# Patient Record
Sex: Male | Born: 1955 | Race: White | Hispanic: No | Marital: Married | Smoking: Former smoker
Health system: Southern US, Community
[De-identification: ages and names within clinical notes are randomized; demographics above are authoritative.]

## PROBLEM LIST (undated history)

## (undated) DIAGNOSIS — E785 Hyperlipidemia, unspecified: Secondary | ICD-10-CM

## (undated) DIAGNOSIS — G4730 Sleep apnea, unspecified: Secondary | ICD-10-CM

## (undated) DIAGNOSIS — E669 Obesity, unspecified: Secondary | ICD-10-CM

## (undated) DIAGNOSIS — I10 Essential (primary) hypertension: Secondary | ICD-10-CM

## (undated) DIAGNOSIS — E119 Type 2 diabetes mellitus without complications: Secondary | ICD-10-CM

## (undated) DIAGNOSIS — K2270 Barrett's esophagus without dysplasia: Secondary | ICD-10-CM

## (undated) HISTORY — PX: OTHER SURGICAL HISTORY: SHX169

## (undated) HISTORY — DX: Hyperlipidemia, unspecified: E78.5

---

## 2005-03-12 ENCOUNTER — Emergency Department (HOSPITAL_COMMUNITY): Admission: EM | Admit: 2005-03-12 | Discharge: 2005-03-13 | Payer: Self-pay | Admitting: Emergency Medicine

## 2007-05-28 ENCOUNTER — Encounter: Admission: RE | Admit: 2007-05-28 | Discharge: 2007-05-28 | Payer: Self-pay | Admitting: Specialist

## 2009-01-05 ENCOUNTER — Emergency Department (HOSPITAL_COMMUNITY): Admission: EM | Admit: 2009-01-05 | Discharge: 2009-01-05 | Payer: Self-pay | Admitting: Emergency Medicine

## 2011-07-17 ENCOUNTER — Encounter (HOSPITAL_COMMUNITY): Payer: Self-pay | Admitting: General Surgery

## 2011-07-17 ENCOUNTER — Telehealth (INDEPENDENT_AMBULATORY_CARE_PROVIDER_SITE_OTHER): Payer: Self-pay | Admitting: General Surgery

## 2011-07-17 ENCOUNTER — Other Ambulatory Visit: Payer: Self-pay

## 2011-07-17 ENCOUNTER — Ambulatory Visit (HOSPITAL_COMMUNITY)
Admission: EM | Admit: 2011-07-17 | Discharge: 2011-07-18 | Disposition: A | Discharge status: Home / Self Care | Payer: No Typology Code available for payment source | Attending: Surgery | Admitting: Surgery

## 2011-07-17 ENCOUNTER — Encounter (HOSPITAL_COMMUNITY): Payer: Self-pay | Admitting: Anesthesiology

## 2011-07-17 ENCOUNTER — Encounter (HOSPITAL_COMMUNITY): Admission: EM | Disposition: A | Discharge status: Home / Self Care | Payer: Self-pay | Source: Home / Self Care

## 2011-07-17 ENCOUNTER — Emergency Department (HOSPITAL_COMMUNITY): Payer: No Typology Code available for payment source | Admitting: Anesthesiology

## 2011-07-17 DIAGNOSIS — Z6830 Body mass index (BMI) 30.0-30.9, adult: Secondary | ICD-10-CM | POA: Insufficient documentation

## 2011-07-17 DIAGNOSIS — E669 Obesity, unspecified: Secondary | ICD-10-CM

## 2011-07-17 DIAGNOSIS — K420 Umbilical hernia with obstruction, without gangrene: Secondary | ICD-10-CM | POA: Diagnosis present

## 2011-07-17 DIAGNOSIS — K55059 Acute (reversible) ischemia of intestine, part and extent unspecified: Secondary | ICD-10-CM | POA: Insufficient documentation

## 2011-07-17 DIAGNOSIS — E785 Hyperlipidemia, unspecified: Secondary | ICD-10-CM

## 2011-07-17 DIAGNOSIS — Z79899 Other long term (current) drug therapy: Secondary | ICD-10-CM | POA: Insufficient documentation

## 2011-07-17 DIAGNOSIS — I10 Essential (primary) hypertension: Secondary | ICD-10-CM | POA: Diagnosis present

## 2011-07-17 DIAGNOSIS — R630 Anorexia: Secondary | ICD-10-CM | POA: Insufficient documentation

## 2011-07-17 HISTORY — DX: Essential (primary) hypertension: I10

## 2011-07-17 HISTORY — PX: UMBILICAL HERNIA REPAIR: SHX196

## 2011-07-17 HISTORY — PX: HERNIA REPAIR: SHX51

## 2011-07-17 HISTORY — DX: Obesity, unspecified: E66.9

## 2011-07-17 HISTORY — DX: Hyperlipidemia, unspecified: E78.5

## 2011-07-17 LAB — URINALYSIS, ROUTINE W REFLEX MICROSCOPIC
Bilirubin Urine: NEGATIVE
Glucose, UA: NEGATIVE mg/dL
Ketones, ur: NEGATIVE mg/dL
Leukocytes, UA: NEGATIVE
Nitrite: NEGATIVE
Protein, ur: NEGATIVE mg/dL
pH: 5 (ref 5.0–8.0)

## 2011-07-17 LAB — COMPREHENSIVE METABOLIC PANEL
AST: 30 U/L (ref 0–37)
Albumin: 4 g/dL (ref 3.5–5.2)
Alkaline Phosphatase: 60 U/L (ref 39–117)
BUN: 16 mg/dL (ref 6–23)
CO2: 29 mEq/L (ref 19–32)
Chloride: 102 mEq/L (ref 96–112)
Creatinine, Ser: 0.87 mg/dL (ref 0.50–1.35)
GFR calc Af Amer: >90 mL/min (ref >90)
GFR calc non Af Amer: >90 mL/min (ref >90)
Glucose, Bld: 88 mg/dL (ref 70–99)
Potassium: 3.1 mEq/L — ABNORMAL LOW (ref 3.5–5.1)
Total Bilirubin: 0.9 mg/dL (ref 0.3–1.2)
Total Protein: 7 g/dL (ref 6.0–8.3)

## 2011-07-17 LAB — DIFFERENTIAL
Eosinophils Relative: 2 % (ref 0–5)
Lymphocytes Relative: 23 % (ref 12–46)
Lymphs Abs: 1.6 10*3/uL (ref 0.7–4.0)
Monocytes Absolute: 0.5 10*3/uL (ref 0.1–1.0)
Monocytes Relative: 8 % (ref 3–12)
Neutro Abs: 4.4 10*3/uL (ref 1.7–7.7)
Neutrophils Relative %: 66 % (ref 43–77)

## 2011-07-17 LAB — CBC
Hemoglobin: 15.6 g/dL (ref 13.0–17.0)
MCHC: 35.1 g/dL (ref 30.0–36.0)
MCV: 88.1 fL (ref 78.0–100.0)
RBC: 5.04 MIL/uL (ref 4.22–5.81)
RDW: 13.1 % (ref 11.5–15.5)

## 2011-07-17 SURGERY — OMENTECTOMY
Anesthesia: General | Site: Abdomen | Wound class: Clean Contaminated

## 2011-07-17 MED ORDER — ONDANSETRON HCL 4 MG/2ML IJ SOLN: 4.0000 mg | Freq: Four times a day (QID) | INTRAMUSCULAR | Status: DC | PRN

## 2011-07-17 MED ORDER — FENTANYL CITRATE 0.05 MG/ML IJ SOLN
25.0000 ug | INTRAMUSCULAR | Status: DC | PRN
  Administered 2011-07-17: 50 ug via INTRAVENOUS

## 2011-07-17 MED ORDER — MORPHINE SULFATE 4 MG/ML IJ SOLN: 4.0000 mg | INTRAMUSCULAR | Status: DC | PRN

## 2011-07-17 MED ORDER — CEFAZOLIN SODIUM-DEXTROSE 2-3 GM-% IV SOLR
INTRAVENOUS | Status: AC
  Filled 2011-07-17: qty 50

## 2011-07-17 MED ORDER — EPHEDRINE SULFATE 50 MG/ML IJ SOLN
INTRAMUSCULAR | Status: DC | PRN
  Administered 2011-07-17: 10 mg via INTRAVENOUS

## 2011-07-17 MED ORDER — SUCCINYLCHOLINE CHLORIDE 20 MG/ML IJ SOLN
INTRAMUSCULAR | Status: DC | PRN
  Administered 2011-07-17: 140 mg via INTRAVENOUS

## 2011-07-17 MED ORDER — PROPOFOL 10 MG/ML IV BOLUS
INTRAVENOUS | Status: DC | PRN
  Administered 2011-07-17: 200 mg via INTRAVENOUS

## 2011-07-17 MED ORDER — FENTANYL CITRATE 0.05 MG/ML IJ SOLN
INTRAMUSCULAR | Status: AC
  Filled 2011-07-17: qty 2

## 2011-07-17 MED ORDER — MORPHINE SULFATE 4 MG/ML IJ SOLN
4.0000 mg | INTRAMUSCULAR | Status: DC | PRN
  Administered 2011-07-17: 4 mg via INTRAVENOUS
  Filled 2011-07-17: qty 1

## 2011-07-17 MED ORDER — SIMVASTATIN 10 MG PO TABS
20.0000 mg | ORAL_TABLET | Freq: Every day | ORAL | Status: DC
  Filled 2011-07-17: qty 2

## 2011-07-17 MED ORDER — ONDANSETRON HCL 4 MG PO TABS: 4.0000 mg | ORAL_TABLET | Freq: Four times a day (QID) | ORAL | Status: DC | PRN

## 2011-07-17 MED ORDER — DEXAMETHASONE SODIUM PHOSPHATE 10 MG/ML IJ SOLN
INTRAMUSCULAR | Status: DC | PRN
  Administered 2011-07-17: 10 mg via INTRAVENOUS

## 2011-07-17 MED ORDER — LACTATED RINGERS IV SOLN: INTRAVENOUS | Status: DC

## 2011-07-17 MED ORDER — HEPARIN SODIUM (PORCINE) 5000 UNIT/ML IJ SOLN
5000.0000 [IU] | Freq: Three times a day (TID) | INTRAMUSCULAR | Status: DC
  Administered 2011-07-18 (×2): 5000 [IU] via SUBCUTANEOUS
  Filled 2011-07-17 (×4): qty 1

## 2011-07-17 MED ORDER — ACETAMINOPHEN 10 MG/ML IV SOLN
INTRAVENOUS | Status: DC | PRN
  Administered 2011-07-17: 1000 mg via INTRAVENOUS

## 2011-07-17 MED ORDER — ASPIRIN EC 81 MG PO TBEC
81.0000 mg | DELAYED_RELEASE_TABLET | Freq: Every day | ORAL | Status: DC
  Administered 2011-07-17 – 2011-07-18 (×2): 81 mg via ORAL
  Filled 2011-07-17 (×2): qty 1

## 2011-07-17 MED ORDER — POTASSIUM CHLORIDE IN NACL 20-0.9 MEQ/L-% IV SOLN
INTRAVENOUS | Status: DC
  Administered 2011-07-17 – 2011-07-18 (×2): via INTRAVENOUS
  Filled 2011-07-17 (×5): qty 1000

## 2011-07-17 MED ORDER — BUPIVACAINE-EPINEPHRINE (PF) 0.5% -1:200000 IJ SOLN
INTRAMUSCULAR | Status: AC
  Filled 2011-07-17: qty 10

## 2011-07-17 MED ORDER — GLYCOPYRROLATE 0.2 MG/ML IJ SOLN
INTRAMUSCULAR | Status: DC | PRN
  Administered 2011-07-17: .5 mg via INTRAVENOUS

## 2011-07-17 MED ORDER — HYDROCHLOROTHIAZIDE 25 MG PO TABS
25.0000 mg | ORAL_TABLET | Freq: Every day | ORAL | Status: DC
  Administered 2011-07-17: 25 mg via ORAL
  Filled 2011-07-17 (×2): qty 1

## 2011-07-17 MED ORDER — SODIUM CHLORIDE 0.9 % IV SOLN
INTRAVENOUS | Status: DC
  Administered 2011-07-17: 16:00:00 via INTRAVENOUS

## 2011-07-17 MED ORDER — CEFAZOLIN SODIUM-DEXTROSE 2-3 GM-% IV SOLR
2.0000 g | Freq: Three times a day (TID) | INTRAVENOUS | Status: DC
  Administered 2011-07-17 (×2): 2 g via INTRAVENOUS
  Filled 2011-07-17 (×3): qty 50

## 2011-07-17 MED ORDER — LISINOPRIL-HYDROCHLOROTHIAZIDE 20-25 MG PO TABS
1.0000 | ORAL_TABLET | Freq: Every day | ORAL | Status: DC
Start: 2011-07-17 — End: 2011-07-17

## 2011-07-17 MED ORDER — ASPIRIN 81 MG PO TABS: 81.0000 mg | ORAL_TABLET | Freq: Every day | ORAL | Status: DC

## 2011-07-17 MED ORDER — SODIUM CHLORIDE 0.9 % IR SOLN
Status: DC | PRN
  Administered 2011-07-17: 1000 mL

## 2011-07-17 MED ORDER — HYDROCODONE-ACETAMINOPHEN 5-325 MG PO TABS
1.0000 | ORAL_TABLET | ORAL | Status: DC | PRN
  Administered 2011-07-18: 1 via ORAL
  Filled 2011-07-17: qty 2

## 2011-07-17 MED ORDER — ONDANSETRON HCL 4 MG/2ML IJ SOLN
INTRAMUSCULAR | Status: DC | PRN
  Administered 2011-07-17: 4 mg via INTRAVENOUS

## 2011-07-17 MED ORDER — ACETAMINOPHEN 10 MG/ML IV SOLN
INTRAVENOUS | Status: AC
  Filled 2011-07-17: qty 100

## 2011-07-17 MED ORDER — BUPIVACAINE-EPINEPHRINE 0.5% -1:200000 IJ SOLN
INTRAMUSCULAR | Status: DC | PRN
  Administered 2011-07-17: 20 mL

## 2011-07-17 MED ORDER — LISINOPRIL 20 MG PO TABS
20.0000 mg | ORAL_TABLET | Freq: Every day | ORAL | Status: DC
  Administered 2011-07-17: 20 mg via ORAL
  Filled 2011-07-17 (×2): qty 1

## 2011-07-17 MED ORDER — LACTATED RINGERS IV SOLN
INTRAVENOUS | Status: DC | PRN
  Administered 2011-07-17: 18:00:00 via INTRAVENOUS

## 2011-07-17 MED ORDER — MIDAZOLAM HCL 5 MG/5ML IJ SOLN: INTRAMUSCULAR | Status: DC | PRN

## 2011-07-17 MED ORDER — ROCURONIUM BROMIDE 100 MG/10ML IV SOLN
INTRAVENOUS | Status: DC | PRN
  Administered 2011-07-17: 20 mg via INTRAVENOUS
  Administered 2011-07-17: 5 mg via INTRAVENOUS

## 2011-07-17 MED ORDER — PROMETHAZINE HCL 25 MG/ML IJ SOLN: 6.2500 mg | INTRAMUSCULAR | Status: DC | PRN

## 2011-07-17 MED ORDER — HEPARIN SODIUM (PORCINE) 5000 UNIT/ML IJ SOLN
5000.0000 [IU] | Freq: Three times a day (TID) | INTRAMUSCULAR | Status: DC
  Filled 2011-07-17 (×6): qty 1

## 2011-07-17 MED ORDER — NEOSTIGMINE METHYLSULFATE 1 MG/ML IJ SOLN
INTRAMUSCULAR | Status: DC | PRN
  Administered 2011-07-17: 4 mg via INTRAVENOUS

## 2011-07-17 MED ORDER — FENTANYL CITRATE 0.05 MG/ML IJ SOLN
INTRAMUSCULAR | Status: DC | PRN
  Administered 2011-07-17 (×3): 50 ug via INTRAVENOUS
  Administered 2011-07-17: 100 ug via INTRAVENOUS

## 2011-07-17 MED ORDER — CEFAZOLIN SODIUM-DEXTROSE 2-3 GM-% IV SOLR
2.0000 g | Freq: Three times a day (TID) | INTRAVENOUS | Status: DC
  Administered 2011-07-18 (×2): 2 g via INTRAVENOUS
  Filled 2011-07-17 (×5): qty 50

## 2011-07-17 SURGICAL SUPPLY — 39 items
APPLICATOR COTTON TIP 6IN STRL (MISCELLANEOUS) ×1 IMPLANT
BLADE EXTENDED COATED 6.5IN (ELECTRODE) IMPLANT
BLADE HEX COATED 2.75 (ELECTRODE) ×3 IMPLANT
CANISTER SUCTION 2500CC (MISCELLANEOUS) ×1 IMPLANT
CLOTH BEACON ORANGE TIMEOUT ST (SAFETY) ×3 IMPLANT
COVER MAYO STAND STRL (DRAPES) ×2 IMPLANT
DRAPE LAPAROSCOPIC ABDOMINAL (DRAPES) ×3 IMPLANT
DRAPE WARM FLUID 44X44 (DRAPE) ×2 IMPLANT
ELECT REM PT RETURN 9FT ADLT (ELECTROSURGICAL) ×3
ELECTRODE REM PT RTRN 9FT ADLT (ELECTROSURGICAL) ×2 IMPLANT
GLOVE BIOGEL PI IND STRL 7.0 (GLOVE) ×2 IMPLANT
GLOVE BIOGEL PI INDICATOR 7.0 (GLOVE) ×1
GLOVE EUDERMIC 7 POWDERFREE (GLOVE) ×3 IMPLANT
GOWN STRL NON-REIN LRG LVL3 (GOWN DISPOSABLE) ×3 IMPLANT
GOWN STRL REIN XL XLG (GOWN DISPOSABLE) ×6 IMPLANT
KIT BASIN OR (CUSTOM PROCEDURE TRAY) ×3 IMPLANT
NS IRRIG 1000ML POUR BTL (IV SOLUTION) ×3 IMPLANT
PACK GENERAL/GYN (CUSTOM PROCEDURE TRAY) ×3 IMPLANT
SPONGE GAUZE 4X4 12PLY (GAUZE/BANDAGES/DRESSINGS) ×3 IMPLANT
SPONGE LAP 18X18 X RAY DECT (DISPOSABLE) ×2 IMPLANT
STAPLER VISISTAT 35W (STAPLE) ×3 IMPLANT
SUCTION POOLE TIP (SUCTIONS) ×2 IMPLANT
SUT MNCRL AB 4-0 PS2 18 (SUTURE) ×2 IMPLANT
SUT NOVA NAB DX-16 0-1 5-0 T12 (SUTURE) ×2 IMPLANT
SUT PDS AB 1 CTX 36 (SUTURE) IMPLANT
SUT PDS AB 1 TP1 96 (SUTURE) ×2 IMPLANT
SUT SILK 2 0 (SUTURE) ×3
SUT SILK 2 0 SH CR/8 (SUTURE) ×2 IMPLANT
SUT SILK 2-0 18XBRD TIE 12 (SUTURE) ×1 IMPLANT
SUT SILK 3 0 (SUTURE) ×3
SUT SILK 3 0 SH CR/8 (SUTURE) ×2 IMPLANT
SUT SILK 3-0 18XBRD TIE 12 (SUTURE) ×1 IMPLANT
SUT VIC AB 3-0 SH 18 (SUTURE) ×2 IMPLANT
SUT VICRYL 2 0 18  UND BR (SUTURE) ×1
SUT VICRYL 2 0 18 UND BR (SUTURE) ×1 IMPLANT
TAPE CLOTH SURG 4X10 WHT LF (GAUZE/BANDAGES/DRESSINGS) ×2 IMPLANT
TOWEL OR 17X26 10 PK STRL BLUE (TOWEL DISPOSABLE) ×6 IMPLANT
TRAY FOLEY CATH 14FRSI W/METER (CATHETERS) IMPLANT
YANKAUER SUCT BULB TIP NO VENT (SUCTIONS) ×2 IMPLANT

## 2011-07-17 NOTE — ED Provider Notes (Addendum)
History     CSN: [id number]  Arrival date & time 07/17/11  1404   First MD Initiated Contact with Patient 07/17/11 1414      No chief complaint on file.   (Consider location/radiation/quality/duration/timing/severity/associated sxs/prior treatment) Patient is a 56 y.o. male presenting with abdominal pain. The history is provided by the patient.  Abdominal Pain The primary symptoms of the illness include abdominal pain. The primary symptoms of the illness do not include nausea, vomiting or diarrhea. Primary symptoms comment: hernia which has been tender and swollen since sat The current episode started 2 days ago. The onset of the illness was sudden. The problem has not changed since onset. Associated with: nothing. The patient has not had a change in bowel habit. Additional symptoms associated with the illness include anorexia. Symptoms associated with the illness do not include chills, constipation, urgency, frequency or back pain.    No past medical history on file.  No past surgical history on file.  No family history on file.  History  Substance Use Topics  . Smoking status: Not on file  . Smokeless tobacco: Not on file  . Alcohol Use: Not on file      Review of Systems  Constitutional: Negative for chills.  Gastrointestinal: Positive for abdominal pain and anorexia. Negative for nausea, vomiting, diarrhea and constipation.  Genitourinary: Negative for urgency and frequency.  Musculoskeletal: Negative for back pain.  All other systems reviewed and are negative.    Allergies  Review of patient's allergies indicates not on file.  Home Medications  No current outpatient prescriptions on file.  BP 152/86  Pulse 105  Temp(Src) 98.8 F (37.1 C) (Oral)  Resp 18  SpO2 100%  Physical Exam  Nursing note and vitals reviewed. Constitutional: He is oriented to person, place, and time. He appears well-developed and well-nourished. No distress.  HENT:  Head:  Normocephalic and atraumatic.  Mouth/Throat: Oropharynx is clear and moist.  Eyes: Conjunctivae and EOM are normal. Pupils are equal, round, and reactive to light.  Neck: Normal range of motion. Neck supple.  Cardiovascular: Normal rate, regular rhythm and intact distal pulses.   No murmur heard. Pulmonary/Chest: Effort normal and breath sounds normal. No respiratory distress. He has no wheezes. He has no rales.  Abdominal: Soft. He exhibits no distension. There is no rebound and no guarding. A hernia is present.       Tender umbilical hernia which is unreducible  Musculoskeletal: Normal range of motion. He exhibits no edema and no tenderness.  Neurological: He is alert and oriented to person, place, and time.  Skin: Skin is warm and dry. No rash noted. No erythema.  Psychiatric: He has a normal mood and affect. His behavior is normal.    ED Course  Procedures (including critical care time)   Labs Reviewed  CBC  DIFFERENTIAL  COMPREHENSIVE METABOLIC PANEL   No results found.   1. Incarcerated umbilical hernia       MDM   Patient with concern for incarcerated umbilical hernia. He was sent from his PCP for evaluation by surgery. CBC and CMP pending. Will discuss with surgery. Unable to reduce on exam here.        Gwyneth Sprout, MD 07/17/11 4098  Gwyneth Sprout, MD 07/17/11 [phone]

## 2011-07-17 NOTE — Anesthesia Postprocedure Evaluation (Signed)
Anesthesia Post Note  Patient: Noah Austin  Procedure(s) Performed: Procedure(s) (LRB): OMENTECTOMY (N/A) HERNIA REPAIR UMBILICAL ADULT (N/A)  Anesthesia type: General  Patient location: PACU  Post pain: Pain level controlled  Post assessment: Post-op Vital signs reviewed  Last Vitals:  Filed Vitals:   07/17/11 1800  BP: 121/69  Pulse: 86  Temp: 36.2 C  Resp: 18    Post vital signs: Reviewed  Level of consciousness: sedated  Complications: No apparent anesthesia complications

## 2011-07-17 NOTE — Op Note (Signed)
Patient Name:           Noah Austin   Date of Surgery:        07/17/2011  Pre op Diagnosis:   Incarcerated umbilical hernia     Post op Diagnosis:    Strangulated umbilical hernia  Procedure:                 Repair strangulated umbilical hernia, partial omentectomy  Surgeon:                     Angelia Mould. Derrell Lolling, M.D., FACS  Assistant:                      none  Operative Indications:   This is a 56 year old man with hypertension, hyperlipidemia, and a BMI of 40 or greater. He has no the desatted umbilical hernia for some time. He presents today with a three-day history of pain and redness at his umbilicus. He denies nausea vomiting or change in his bowel habits. The hernia is more tender than it has ever been. On exam he has an incarcerated umbilical hernia with hernia contents at least 4-5 cm in diameter. The skin is erythematous but there is no blistering and no necrosis of the skin. The rest of the abdomen is soft without signs of any peritoneal irritation.  Operative Findings:       The patient had omentum incarcerated and a small area of omentum was infarcted and had turned black. There was no odor and no purulence but he clearly had some infarcted omentum. This required resection. There was no large or small  bowel involved in the hernia.  Procedure in Detail:          Following the induction of general endotracheal anesthesia the patient's abdomen was prepped and draped in a sterile fashion. Intravenous antibiotics were given. Surgical time out was performed. 0.5% Marcaine with epinephrine was used as local infiltration anesthetic. A curved transverse incision was made at the lower rim of the umbilicus. Dissection was carried down through the  subcutaneous tissue. I dissected out the hernia sac all the way down to the fascia. I mobilized the hernia sac off of the umbilical skin. I opened the hernia sac and found the incarcerated and infarcted omentum. I debrided the entire hernia sac back  down to the fascia. I checked very carefully to make sure there was no involved intestine and there was not. I then took a series of Kelly clamps and clamped and divided the omentum and then tied off the omental areas with 2-0 Vicryl ties. The omentum was sent to pathology lab. There was no bleeding. What was left of the omentum easily returned the abdominal cavity at this point. I was then able to insert my finger into the hernia defect and feel around in all directions. There was no other incarcerated contents. I undermined the subcutaneous tissue circumferentially. I then closed the fascial defect with 5 interrupted sutures of #1 Novofil. I did not feel comfortable placing mesh because of the infarcted tissue and the risk of bacterial contamination. These 5 Novafil sutures repaired the hernia quite nicely. The wound was irrigated with saline. The umbilicus was tacked back to the fascia with a 3-0 Vicryl suture. Subcutaneous tissue were closed with multiple interrupted sutures of 3-0 Vicryl. The skin was closed with a running subcuticular suture of 4-0 Monocryl. Clean bandages were placed and the patient taken to recovery in stable condition. Estimated blood  loss 25 cc. Complications none. Counts correct.     Angelia Mould. Derrell Lolling, M.D., FACS General and Minimally Invasive Surgery Breast and Colorectal Surgery  07/17/2011 5:44 PM

## 2011-07-17 NOTE — Transfer of Care (Signed)
Immediate Anesthesia Transfer of Care Note  Patient: Noah Austin  Procedure(s) Performed: Procedure(s) (LRB): OMENTECTOMY (N/A) HERNIA REPAIR UMBILICAL ADULT (N/A)  Patient Location: PACU  Anesthesia Type: General  Level of Consciousness: awake, alert , oriented and patient cooperative  Airway & Oxygen Therapy: Patient Spontanous Breathing and Patient connected to face mask oxygen  Post-op Assessment: Report given to PACU RN and Post -op Vital signs reviewed and stable  Post vital signs: Reviewed and stable  Complications: No apparent anesthesia complications

## 2011-07-17 NOTE — Telephone Encounter (Signed)
[facility] called to refer patient with incarcerated hernia. I informed them this is usually seen in the ER. They stated this is non-reducible, red and painful. I paged Dr Derrell Lolling to speak directly with Dr Renato Gails about patient.

## 2011-07-17 NOTE — H&P (Signed)
Noah Austin is an 56 y.o. male.   Chief Complaint: Abdominal Pain PCP:  Noah Austin HPI: Patient's a 56 year old gentleman with an umbilical hernia for approximately 2 years. Never bothered him before. On Saturday we first got up he noted discomfort at the site. Become progressively worse over the weekend. He saw Noah Austin in his office this morning and he referred the patient to Noah Austin in ER and Dr. Claud Austin. He was seen in the emergency room by Dr. Derrell Austin. Because of the tenderness and erythema it was his opinion the patient be taken the operating room for exploratory laparotomy and repair of his inguinal hernia. At this point he plans no other procedures. He is concerned he has some fat or perhaps bowel incarcerated in the umbilical hernia sac. Dr. Derrell Austin discussed the risks and benefits to the patient for surgery. He explained reasons behind recommended surgery. Patient agreed with surgical intervention at this point. Currently all labs and studies are pending. Past Medical History  Diagnosis Date  . Hypertension   . Dyslipidemia   . Obesity (BMI 35.0-39.9 without comorbidity)     Past Surgical History  Procedure Date  . Left inguinal hernia     Repair about 27 years ago    No family history on file. Social History:  reports that he has quit smoking. He does not have any smokeless tobacco history on file. He reports that he does not drink alcohol or use illicit drugs.  Allergies: Allergies not on file  Medications Prior to Admission  Medication Dose Route Frequency Provider Last Rate Last Dose  . 0.9 %  sodium chloride infusion   Intravenous Continuous Noah Mention, MD      . ceFAZolin (ANCEF) IVPB 2 g/50 mL premix  2 g Intravenous Q8H Noah Mention, MD      . heparin injection 5,000 Units  5,000 Units Subcutaneous Q8H Noah Mention, MD      . morphine 4 MG/ML injection 4 mg  4 mg Intravenous Q1H PRN Noah Mention, MD      . ondansetron [facility])  injection 4 mg  4 mg Intravenous Q6H PRN Noah Mention, MD       No current outpatient prescriptions on file as of 07/17/2011.    No results found for this or any previous visit (from the past 48 hour(s)). No results found.  Review of Systems  Constitutional: Negative for fever, chills, weight loss, malaise/fatigue and diaphoresis.  HENT: Negative.   Eyes: Negative.   Respiratory: Negative.        Snore quite a bit does not have a history of apnea  Cardiovascular: Negative.   Gastrointestinal: Positive for abdominal pain. Negative for heartburn, nausea, vomiting, diarrhea, constipation and blood in stool.  Genitourinary: Negative.        +Erectile dysfunction  Musculoskeletal: Positive for back pain (this comes and goes, not sure what it's related to.).  Skin: Negative.   Neurological: Negative.   Endo/Heme/Allergies: Negative.   Psychiatric/Behavioral: Negative.     Blood pressure 152/86, pulse 105, temperature 98.8 F (37.1 C), temperature source Oral, resp. rate 18, SpO2 100.00%. Physical Exam  Constitutional: He is oriented to person, place, and time. He appears well-developed and well-nourished. No distress.  HENT:  Head: Normocephalic and atraumatic.  Nose: Nose normal.  Eyes: Conjunctivae and EOM are normal. Pupils are equal, round, and reactive to light.  Neck: Normal range of motion. Neck supple. No JVD present. No tracheal deviation present.  No thyromegaly present.  Cardiovascular: Normal rate, regular rhythm, normal heart sounds and intact distal pulses.  Exam reveals no gallop.   No murmur heard.      Heart rate is up in 100's  Respiratory: Effort normal and breath sounds normal. No respiratory distress. He has no wheezes. He has no rales.  GI: Soft. Bowel sounds are normal. He exhibits no distension and no mass. There is tenderness (over umbilicus). There is no rebound and no guarding.       Morbidly obese,  4x4 cm umbilical hernia, which is red and tender.   Tender and red around border of umbilicus also.   About 10 cm total diameter around umbilicus.  Musculoskeletal: Normal range of motion. He exhibits no edema and no tenderness.  Lymphadenopathy:    He has no cervical adenopathy.  Neurological: He is alert and oriented to person, place, and time. He has normal reflexes. No cranial nerve deficit. Coordination normal.  Skin: He is not diaphoretic.       Has sebaceous cyst on chest and back. Both are about 2.5 cm in diameter, non tender.  Psychiatric: He has a normal mood and affect. His behavior is normal. Judgment and thought content normal.     Assessment/Plan 1. Umbilical hernia with cellulitis; possible incarceration. 2. History of hypertension on medication. 3. History of dyslipidemia 4. BMI of 38.3  Plan: Patient's been seen and evaluated by Dr. Derrell Austin. Risk and benefits of surgery been discussed with plan to the patient the operating room this afternoon.  Will [facility] physician assistant for Dr. Claud Austin.  Noah Austin 07/17/2011, 3:19 PM

## 2011-07-17 NOTE — Preoperative (Signed)
Beta Blockers   Reason not to administer Beta Blockers:Not Applicable 

## 2011-07-17 NOTE — H&P (Signed)
Surgery attending note:  This patient is interviewed and examined. I have discussed his past history and his current problem with Dr. [facility]. I agree with the evaluation and management plan that is outlined by Mr. Zola Button, [place].  This patient has an incarcerated, non-reducible  umbilical hernia and the hernia sac is at least 4 cm in size. It is tender with erythematous skin.   My concern is that he has infarcted omentum. There are no obstructive signs and and so it is less likely that he has incarcerated or strangulated bowel, but that is still possible.  Plan: He'll be taken to the operating room now for open repair of his incarcerated umbilical hernia, possible intestinal resection.  I have discussed the indications and details of the surgery with him. Risks and complications have been outlined, including but not limited to bleeding, infection, intestinal resection, recurrence of the hernia, cardiac, pulmonary and thromboembolic problems. He understands these issues. His questions were answered. He agrees with this plan.   Angelia Mould. Derrell Lolling, M.D., [facility] General and Minimally invasive Surgery Breast and Colorectal Surgery Office:   [phone] Pager:   [phone]

## 2011-07-17 NOTE — Anesthesia Preprocedure Evaluation (Addendum)
Anesthesia Evaluation  Patient identified by MRN, date of birth, ID band Patient awake    Reviewed: Allergy & Precautions, H&P , NPO status , Patient's Chart, lab work & pertinent test results  Airway Mallampati: II TM Distance: >3 FB Neck ROM: Full    Dental  (+) Teeth Intact, Partial Upper, Dental Advisory Given and Poor Dentition   Pulmonary neg pulmonary ROS,  breath sounds clear to auscultation  Pulmonary exam normal       Cardiovascular hypertension, Pt. on medications Rhythm:Regular Rate:Normal     Neuro/Psych negative neurological ROS  negative psych ROS   GI/Hepatic negative GI ROS, Neg liver ROS,   Endo/Other  negative endocrine ROSMorbid obesity  Renal/GU negative Renal ROS  negative genitourinary   Musculoskeletal negative musculoskeletal ROS (+)   Abdominal (+) + obese,   Peds negative pediatric ROS (+)  Hematology negative hematology ROS (+)   Anesthesia Other Findings   Reproductive/Obstetrics negative OB ROS                          Anesthesia Physical Anesthesia Plan  ASA: III and Emergent  Anesthesia Plan: General   Post-op Pain Management:    Induction: Intravenous, Cricoid pressure planned and Rapid sequence  Airway Management Planned: Oral ETT  Additional Equipment:   Intra-op Plan:   Post-operative Plan: Extubation in OR  Informed Consent: I have reviewed the patients History and Physical, chart, labs and discussed the procedure including the risks, benefits and alternatives for the proposed anesthesia with the patient or authorized representative who has indicated his/her understanding and acceptance.   Dental advisory given  Plan Discussed with: CRNA  Anesthesia Plan Comments:         Anesthesia Quick Evaluation

## 2011-07-18 MED ORDER — ACETAMINOPHEN 325 MG PO TABS: 650.0000 mg | ORAL_TABLET | ORAL | Status: AC | PRN

## 2011-07-18 MED ORDER — ACETAMINOPHEN 325 MG PO TABS: 650.0000 mg | ORAL_TABLET | ORAL | Status: DC | PRN

## 2011-07-18 MED ORDER — ZOLPIDEM TARTRATE 5 MG PO TABS
5.0000 mg | ORAL_TABLET | Freq: Once | ORAL | Status: AC
  Administered 2011-07-18: 5 mg via ORAL
  Filled 2011-07-18: qty 1

## 2011-07-18 MED ORDER — HYDROCODONE-ACETAMINOPHEN 5-325 MG PO TABS: 1.0000 | ORAL_TABLET | ORAL | Status: AC | PRN

## 2011-07-18 NOTE — Progress Notes (Signed)
1 Day Post-Op  Subjective: Feel pretty good, ate breakfast without issue.  Incision looks good.  Taking IV pain med at present.  Objective: Vital signs in last 24 hours: Temp:  [97.2 F (36.2 C)-98.8 F (37.1 C)] 97.6 F (36.4 C) (04/02 0606) Pulse Rate:  [71-105] 94  (04/02 0606) Resp:  [12-20] 20  (04/02 0606) BP: (98-152)/(56-86) 110/71 mmHg (04/02 0606) SpO2:  [93 %-100 %] 96 % (04/02 0606) Weight:  [131.543 kg (290 lb)] 131.543 kg (290 lb) (04/01 1845) Last BM Date: 07/17/11  Intake/Output from previous day: 04/01 0701 - 04/02 0700 In: 1800 [P.O.:600; I.V.:1200] Out: 130 [Urine:80; Blood:50] Intake/Output this shift:    General appearance: alert, cooperative and no distress Resp: clear to auscultation bilaterally and few rales in base. GI: obese, +BS, some flatus, still having pain from incision.  Dressing clean and dry on removal. erythema appears better.  Lab Results:   [facility] 07/17/11 1525  WBC 6.7  HGB 15.6  HCT 44.4  PLT 213    BMET  Basename 07/17/11 1525  NA 140  K 3.1*  CL 102  CO2 29  GLUCOSE 88  BUN 16  CREATININE 0.87  CALCIUM 9.6   PT/INR No results found for this basename: LABPROT:2,INR:2 in the last 72 hours   Lab 07/17/11 1525  AST 30  ALT 44  ALKPHOS 60  BILITOT 0.9  PROT 7.0  ALBUMIN 4.0     Lipase  No results found for this basename: lipase     Studies/Results: No results found.  Medications:    . aspirin EC  81 mg Oral Daily  .  ceFAZolin (ANCEF) IV  2 g Intravenous Q8H  . fentaNYL      . heparin  5,000 Units Subcutaneous Q8H  . hydrochlorothiazide  25 mg Oral Daily  . lisinopril  20 mg Oral Daily  . simvastatin  20 mg Oral q1800  . zolpidem  5 mg Oral Once  . DISCONTD: aspirin  81 mg Oral Daily  . DISCONTD:  ceFAZolin (ANCEF) IV  2 g Intravenous Q8H  . DISCONTD: heparin  5,000 Units Subcutaneous Q8H  . DISCONTD: lisinopril-hydrochlorothiazide  1 tablet Oral Daily    Assessment/Plan Strangulated umbilical  hernia, s/p Repair strangulated umbilical hernia, partial omentectomy 07/17/11 Dr. Derrell Lolling Possible cellulites at umbilical hernia site. Obesity (BMI 30-39.9)  Umbilical hernia with obstruction  Hypertension  Hyperlipidemia   Plan:  Mobilize, continue antibiotics while here, transition to oral pain meds, Discuss when to send home after he sees DR. Ingram       LOS: 1 day    Sherrie George 07/18/2011

## 2011-07-18 NOTE — Discharge Summary (Signed)
Physician Discharge Summary  Patient ID: Noah Austin MRN: [id number] DOB/AGE: 11-07-1955 56 y.o. Primary Care:  Elias Else Admit date: 07/17/2011 Discharge date: 07/18/2011  Admission Diagnoses: Umbilical hernia with cellulitis; possible incarceration.  2. History of hypertension on medication.  3. History of dyslipidemia  4. BMI of 38.3   Discharge Diagnoses: Strangulated umbilical hernia  Principal Problem:  *Umbilical hernia with obstruction Active Problems:  Obesity (BMI 30-39.9)  Hypertension  Hyperlipidemia   PROCEDURES: Repair strangulated umbilical hernia, partial omentectomy 07/17/2011 Dr. Blanchie Serve Course: This is a 56 year old man with hypertension, hyperlipidemia, and a BMI of 40 or greater. He has no the desatted umbilical hernia for some time. He presents today with a three-day history of pain and redness at his umbilicus. He denies nausea vomiting or change in his bowel habits. The hernia is more tender than it has ever been. On exam he has an incarcerated umbilical hernia with hernia contents at least 4-5 cm in diameter. The skin is erythematous but there is no blistering and no necrosis of the skin. The rest of the abdomen is soft without signs of any peritoneal irritation. In the OR The patient had omentum incarcerated and a small area of omentum was infarcted and had turned black. There was no odor and no purulence but he clearly had some infarcted omentum. This required resection. There was no large or small bowel involved in the hernia. He did well post op and by the afternoon of the 1st post op day he was ready for discharge. Follow up 3 weeks Dr. Derrell Lolling  Condition on D/C: Improved  Disposition: Final discharge disposition not confirmed   Medication List  As of 07/18/2011  3:32 PM   TAKE these medications         acetaminophen 325 MG tablet   Commonly known as: TYLENOL   Take 2 tablets (650 mg total) by mouth every 4 (four) hours as needed.      aspirin 81 MG tablet   Take 81 mg by mouth daily.      HYDROcodone-acetaminophen 5-325 MG per tablet   Commonly known as: NORCO   Take 1-2 tablets by mouth every 4 (four) hours as needed.      lisinopril-hydrochlorothiazide 20-25 MG per tablet   Commonly known as: PRINZIDE,ZESTORETIC   Take 1 tablet by mouth daily.      pravastatin 40 MG tablet   Commonly known as: PRAVACHOL   Take 40 mg by mouth daily.      zolpidem 10 MG tablet   Commonly known as: AMBIEN   Take 10 mg by mouth at bedtime.           Follow-up Information    Follow up with Noah Patella, MD.      Call Noah Patella, MD. (As needed)    Contact information:   [facility] [address] [place] [address] [phone]       Follow up with Noah Mention, MD. Schedule an appointment as soon as possible for a visit in 3 weeks.   Contact information:   [employer] [place] [address], [address] [place] [address] [phone]       Follow up with No lifting over 15 pounds for 4 weeks,,.         Signed: Sherrie Austin 07/18/2011, 3:32 PM

## 2011-07-18 NOTE — Discharge Instructions (Signed)
Umbilical Herniorrhaphy A herniorrhaphy is surgery to repair a hernia. A hernia is a gap or weakness in the muscles of your abdomen. Umbilical means that your hernia is in the area around your belly button. You might be able to feel a small bulge in your abdomen where the hernia is. You might also have pain or discomfort. If the hernia is not repaired, the gap could get bigger. Your intestines could get trapped in the gap. This can be painful. It also can lead to other health problems, such as blocked intestines. LET YOUR CAREGIVER KNOW ABOUT:  Any allergies.   All medications you are taking, including:   Herbs, eyedrops, over-the-counter medications and cream   Blood thinners (anticoagulants), aspirin or other drugs that could affect blood clotting.   Use of steroids (by mouth or as creams).   Previous problems with anesthetics, including local anesthetics.   Possibility of pregnancy, if this applies.   Any history of blood clots.   Any history of bleeding or other blood problems.   Previous surgery.   Smoking history.   Other health problems.  RISKS AND COMPLICATIONS  Short-term possibilities include:   Pain.   Excessive bleeding.   Hematoma. This is a pooling of blood under the wound.   Infection at the surgery site or of the mesh.   Numbness at the surgery site.   Swelling and bruising.   Slow healing.   Blood clots.   Intestine or bowel damage. This is rare.   Longer-term possibilities include:   Scarring.   Skin damage.   The need for additional surgery.   Another hernia.  BEFORE THE PROCEDURE  Stop using aspirin and non-steroidal anti-inflammatory drugs (NSAIDs) for pain relief. Also stop taking vitamin E. If possible, do this two weeks in advance.   If you take blood-thinners, ask your healthcare provider when you should stop taking them.   Do not eat or drink for about 8 hours before your surgery.   You might be asked to shower or wash with a  special antibacterial soap before the procedure.   Wear clothes that will be easy to put on after the surgery.   Arrive at least an hour before the surgery, or whenever your surgeon recommends. This will give you time to check in and fill out any needed paperwork.   This surgery is usually an outpatient procedure, so you will be able to go home the same day. Less often people stay overnight in the hospital after the procedure. Ask your healthcare provider what to expect. Either way, make arrangements in advance for someone to drive you home. After an outpatient procedure, you should have someone stay with you overnight.  PROCEDURE Your procedure can be done with traditional surgery. The surgeon opens the abdomen and repairs the hernia. Or, sometimes it can be done with laparoscopic surgery. Then the procedure is done through multiple small incisions, using a camera to guide the repair. Talk with your surgeon about how the hernia will be repaired.  The preparation:   You will change into a hospital gown.   You will be given an IV. A needle will be inserted in your arm. Medication can flow directly into your body through this needle.   You might be given a sedative to help you relax.   You may be given a drug that will put you to sleep during the surgery (general anesthetic). Or, your abdomen will be numbed, and you will be drowsy but awake (local   anesthetic).   For a traditional surgery (sometimes called open surgery):   Once you are pain-free, the surgeon will make a small cut (incision) in your abdomen.   The gap in the muscle wall will be repaired. The surgeon could sew muscle together over the gap. Or, a mesh or soft screen material can be used to strengthen the area. The mesh acts as a scaffolding and the body grows new strong tissue into and around it. This new tissue is what closes the gap of the hernia and prevents it from coming back.   A drain might be put in. Fluid sometimes  collects under the wound as it heals. The drain helps get the fluid out of the area. A drain will probably be used if your hernia is large.   The surgeon will close the incision with small stitches.   For a laparoscopic surgery:   One you are pain-free, the surgeon will make a small incision in your abdomen.   A thin tube with a tiny camera attached to it will be inserted into the abdomen through the incision. What the camera "sees" is projected on a screen in the room. This gives the surgeon a good view of the hernia.   Other small incisions will be made so the surgeon can insert small tools that are used to repair the hernia.   The incisions will be closed with small stitches.  AFTER THE PROCEDURE  You will be stay in a recovery area until the anesthesia has worn off. Your blood pressure and pulse will be checked every so often.   You might be asked to get up and try walking.   Sometimes people can go home the same day. For others, an overnight stay is needed.  HOME CARE INSTRUCTIONS   Take any medication that your surgeon prescribed. Follow the directions carefully. Take all of the medication.   Ask your surgeon whether you can take over-the-counter medicines for pain, discomfort or fever. Do not take aspirin unless your healthcare team says that you should. Aspirin increases the chances of bleeding.   Do not get the incision area wet for the first few days after surgery (or until your surgeon says it is OK).   Avoid lifting heavy objects (more than 10 lbs, 4.5 kilograms) for 6 to 8 weeks after your surgery.   Expect some pain when climbing stairs for several days after surgery.   Avoid sexual activity for a few weeks. It can be uncomfortable or painful.   You should be able to drive within a few days. However, do not drive until you are no longer taking pain medicine. It can make you drowsy. It also can slow your reaction time.   You should be able to resume normal activity in  a few days. When you can return to work will depend on the type of work you do. You can go back to a desk job much sooner than you can return to work that requires physical labor. Talk about this with your healthcare provider.  SEEK MEDICAL CARE IF:   You notice blood or fluid leaking from the wound.   The area around the incision becomes red or swollen.   You are having problems urinating.   You become nauseous or throw up for more than two days after the surgery.   You develop a fever of more than 100.5 F (38.1 C).  SEEK IMMEDIATE MEDICAL CARE IF:  You develop a fever of more than   102.0 F (38.9 C). Document Released: 06/30/2008 Document Revised: 03/23/2011 Document Reviewed: 06/30/2008 [facility] Patient Information 2012 [place], [place].CCS _______[facility]  UMBILICAL OR INGUINAL HERNIA REPAIR: POST OP INSTRUCTIONS  Always review your discharge instruction sheet given to you by the facility where your surgery was performed. IF YOU HAVE DISABILITY OR FAMILY LEAVE FORMS, YOU MUST BRING THEM TO THE OFFICE FOR PROCESSING.   DO NOT GIVE THEM TO YOUR DOCTOR.  1. A  prescription for pain medication may be given to you upon discharge.  Take your pain medication as prescribed, if needed.  If narcotic pain medicine is not needed, then you may take acetaminophen (Tylenol) or ibuprofen (Advil) as needed. 2. Take your usually prescribed medications unless otherwise directed. 3. If you need a refill on your pain medication, please contact your pharmacy.  They will contact our office to request authorization. Prescriptions will not be filled after 5 pm or on week-ends. 4. You should follow a light diet the first 24 hours after arrival home, such as soup and crackers, etc.  Be sure to include lots of fluids daily.  Resume your normal diet the day after surgery. 5. Most patients will experience some swelling and bruising around the umbilicus or in the groin and scrotum.  Ice packs  and reclining will help.  Swelling and bruising can take several days to resolve.  6. It is common to experience some constipation if taking pain medication after surgery.  Increasing fluid intake and taking a stool softener (such as Colace) will usually help or prevent this problem from occurring.  A mild laxative (Milk of Magnesia or Miralax) should be taken according to package directions if there are no bowel movements after 48 hours. 7. Unless discharge instructions indicate otherwise, you may remove your bandages 24-48 hours after surgery, and you may shower at that time.  You may have steri-strips (small skin tapes) in place directly over the incision.  These strips should be left on the skin for 7-10 days.  If your surgeon used skin glue on the incision, you may shower in 24 hours.  The glue will flake off over the next 2-3 weeks.  Any sutures or staples will be removed at the office during your follow-up visit. 8. ACTIVITIES:  You may resume regular (light) daily activities beginning the next day--such as daily self-care, walking, climbing stairs--gradually increasing activities as tolerated.  You may have sexual intercourse when it is comfortable.  Refrain from any heavy lifting or straining until approved by your doctor. a. You may drive when you are no longer taking prescription pain medication, you can comfortably wear a seatbelt, and you can safely maneuver your car and apply brakes. b. RETURN TO WORK:  __________________________________________________________ 9. You should see your doctor in the office for a follow-up appointment approximately 2-3 weeks after your surgery.  Make sure that you call for this appointment within a day or two after you arrive home to insure a convenient appointment time. 10. OTHER INSTRUCTIONS:   __________________________________________________________________________________________________________________________________________________________________________________________  WHEN TO CALL YOUR DOCTOR: 1. Fever over 101.0 2. Inability to urinate 3. Nausea and/or vomiting 4. Extreme swelling or bruising 5. Continued bleeding from incision. 6. Increased pain, redness, or drainage from the incision  The clinic staff is available to answer your questions during regular business hours.  Please don't hesitate to call and ask to speak to one of the nurses for clinical concerns.  If you have a medical emergency, go to the nearest emergency room  or call 911.  A surgeon from [facility] is always on call at the hospital   [address], [place], [place]  [address] ?  [address] [address], [place], [place]   [address] [phone] ? [phone] ? FAX [phone] Web site: [email or web address]

## 2011-07-18 NOTE — Discharge Summary (Signed)
Patient has done well postop. He is asking to go home. He has tolerated diet. Diet activities and wound care were discussed. I will see him back in the office in 3 weeks.

## 2011-07-18 NOTE — Progress Notes (Signed)
Ancef continued > 24 hours post-op given cellulitis.   Indication confirmed with Camelia Phenes, PA.  Geoffry Paradise, PharmD.   Pager:  [phone] 1:07 PM

## 2011-07-18 NOTE — Plan of Care (Signed)
Problem: Phase II Progression Outcomes Goal: Discharge plan established Outcome: Completed/Met Date Met:  07/18/11 Pt. Discharged to home today. Discharge instructions given to patient and wife. Both verbalized understanding.

## 2011-07-18 NOTE — Progress Notes (Signed)
Patient interviewed and examined. I agree with evaluation and plans as outlined by Mr. Marlyne Beards, [place].  The patient really wants to go home today. He has been ambulating, voiding, and is tolerating his diet.  Abdomen is obese soft. The incision looks fine. Less erythema than preop. No overt signs of infection.  Plan: If he tolerates lunch, and is more active we can let him go home this afternoon. Return to see me in 3 weeks.  No sports or work or heavy lifting until after he sees me in the office in 3 weeks. He was instructed.   Angelia Mould. Derrell Lolling, M.D., [facility] General and Minimally invasive Surgery Breast and Colorectal Surgery Office:   [phone] Pager:   [phone]

## 2011-07-25 ENCOUNTER — Telehealth (INDEPENDENT_AMBULATORY_CARE_PROVIDER_SITE_OTHER): Payer: Self-pay | Admitting: General Surgery

## 2011-07-26 ENCOUNTER — Telehealth (INDEPENDENT_AMBULATORY_CARE_PROVIDER_SITE_OTHER): Payer: Self-pay

## 2011-07-26 NOTE — Telephone Encounter (Signed)
Pt home doing well. PO appt made. 

## 2011-07-27 ENCOUNTER — Encounter (HOSPITAL_COMMUNITY): Payer: Self-pay | Admitting: General Surgery

## 2011-08-10 ENCOUNTER — Encounter (INDEPENDENT_AMBULATORY_CARE_PROVIDER_SITE_OTHER): Payer: Self-pay | Admitting: General Surgery

## 2011-08-10 ENCOUNTER — Ambulatory Visit (INDEPENDENT_AMBULATORY_CARE_PROVIDER_SITE_OTHER): Payer: No Typology Code available for payment source | Admitting: General Surgery

## 2011-08-10 VITALS — BP 136/90 | HR 100 | Temp 98.0°F | Resp 20 | Ht 73.5 in | Wt 281.2 lb

## 2011-08-10 DIAGNOSIS — K420 Umbilical hernia with obstruction, without gangrene: Secondary | ICD-10-CM

## 2011-08-10 NOTE — Patient Instructions (Signed)
You are  recovering from your umbilical hernia surgery without any obvious complication.  You may return to work now but only light duty.  You may return to full activities without restriction on or after May 6.  Return to see Dr. Derrell Lolling if further problems arise.   Umbilical Herniorrhaphy A herniorrhaphy is surgery to repair a hernia. A hernia is a gap or weakness in the muscles of your abdomen. Umbilical means that your hernia is in the area around your belly button. You might be able to feel a small bulge in your abdomen where the hernia is. You might also have pain or discomfort. If the hernia is not repaired, the gap could get bigger. Your intestines could get trapped in the gap. This can be painful. It also can lead to other health problems, such as blocked intestines. LET YOUR CAREGIVER KNOW ABOUT:  Any allergies.   All medications you are taking, including:   Herbs, eyedrops, over-the-counter medications and cream   Blood thinners (anticoagulants), aspirin or other drugs that could affect blood clotting.   Use of steroids (by mouth or as creams).   Previous problems with anesthetics, including local anesthetics.   Possibility of pregnancy, if this applies.   Any history of blood clots.   Any history of bleeding or other blood problems.   Previous surgery.   Smoking history.   Other health problems.  RISKS AND COMPLICATIONS  Short-term possibilities include:   Pain.   Excessive bleeding.   Hematoma. This is a pooling of blood under the wound.   Infection at the surgery site or of the mesh.   Numbness at the surgery site.   Swelling and bruising.   Slow healing.   Blood clots.   Intestine or bowel damage. This is rare.   Longer-term possibilities include:   Scarring.   Skin damage.   The need for additional surgery.   Another hernia.  BEFORE THE PROCEDURE  Stop using aspirin and non-steroidal anti-inflammatory drugs (NSAIDs) for pain relief.  Also stop taking vitamin E. If possible, do this two weeks in advance.   If you take blood-thinners, ask your healthcare provider when you should stop taking them.   Do not eat or drink for about 8 hours before your surgery.   You might be asked to shower or wash with a special antibacterial soap before the procedure.   Wear clothes that will be easy to put on after the surgery.   Arrive at least an hour before the surgery, or whenever your surgeon recommends. This will give you time to check in and fill out any needed paperwork.   This surgery is usually an outpatient procedure, so you will be able to go home the same day. Less often people stay overnight in the hospital after the procedure. Ask your healthcare provider what to expect. Either way, make arrangements in advance for someone to drive you home. After an outpatient procedure, you should have someone stay with you overnight.  PROCEDURE Your procedure can be done with traditional surgery. The surgeon opens the abdomen and repairs the hernia. Or, sometimes it can be done with laparoscopic surgery. Then the procedure is done through multiple small incisions, using a camera to guide the repair. Talk with your surgeon about how the hernia will be repaired.  The preparation:   You will change into a hospital gown.   You will be given an IV. A needle will be inserted in your arm. Medication can flow directly into  your body through this needle.   You might be given a sedative to help you relax.   You may be given a drug that will put you to sleep during the surgery (general anesthetic). Or, your abdomen will be numbed, and you will be drowsy but awake (local anesthetic).   For a traditional surgery (sometimes called open surgery):   Once you are pain-free, the surgeon will make a small cut (incision) in your abdomen.   The gap in the muscle wall will be repaired. The surgeon could sew muscle together over the gap. Or, a mesh or soft  screen material can be used to strengthen the area. The mesh acts as a scaffolding and the body grows new strong tissue into and around it. This new tissue is what closes the gap of the hernia and prevents it from coming back.   A drain might be put in. Fluid sometimes collects under the wound as it heals. The drain helps get the fluid out of the area. A drain will probably be used if your hernia is large.   The surgeon will close the incision with small stitches.   For a laparoscopic surgery:   One you are pain-free, the surgeon will make a small incision in your abdomen.   A thin tube with a tiny camera attached to it will be inserted into the abdomen through the incision. What the camera "sees" is projected on a screen in the room. This gives the surgeon a good view of the hernia.   Other small incisions will be made so the surgeon can insert small tools that are used to repair the hernia.   The incisions will be closed with small stitches.  AFTER THE PROCEDURE  You will be stay in a recovery area until the anesthesia has worn off. Your blood pressure and pulse will be checked every so often.   You might be asked to get up and try walking.   Sometimes people can go home the same day. For others, an overnight stay is needed.  HOME CARE INSTRUCTIONS   Take any medication that your surgeon prescribed. Follow the directions carefully. Take all of the medication.   Ask your surgeon whether you can take over-the-counter medicines for pain, discomfort or fever. Do not take aspirin unless your healthcare team says that you should. Aspirin increases the chances of bleeding.   Do not get the incision area wet for the first few days after surgery (or until your surgeon says it is OK).   Avoid lifting heavy objects (more than 10 lbs, 4.5 kilograms) for 6 to 8 weeks after your surgery.   Expect some pain when climbing stairs for several days after surgery.   Avoid sexual activity for a few  weeks. It can be uncomfortable or painful.   You should be able to drive within a few days. However, do not drive until you are no longer taking pain medicine. It can make you drowsy. It also can slow your reaction time.   You should be able to resume normal activity in a few days. When you can return to work will depend on the type of work you do. You can go back to a desk job much sooner than you can return to work that requires physical labor. Talk about this with your healthcare provider.  SEEK MEDICAL CARE IF:   You notice blood or fluid leaking from the wound.   The area around the incision becomes red or swollen.  You are having problems urinating.   You become nauseous or throw up for more than two days after the surgery.   You develop a fever of more than 100.5 F (38.1 C).  SEEK IMMEDIATE MEDICAL CARE IF:  You develop a fever of more than 102.0 F (38.9 C). Document Released: 06/30/2008 Document Revised: 03/23/2011 Document Reviewed: 06/30/2008 [facility] Patient Information 2012 [place], [place].

## 2011-08-10 NOTE — Progress Notes (Signed)
Subjective:     Patient ID: Noah Austin, male   DOB: 16-Jun-1955, 56 y.o.   MRN: [id number]  HPI This patient underwent emergent repair of strangulated umbilical hernia on April 1. He had infarcted omentum and reddened,  potentially infected skin.  there was no incarcerated intestinal contents. The fascia was repaired primarily without mesh.  He is doingell. Back to normal sedentary activities. Normal diet normal bowel function no wound problems. He is asking when he can ride his motorcycle and go  back to work in an  [employer].  Review of Systems     Objective:   Physical Exam Patient looks well. No distress. Wife is with him.  Abdomen obese. Soft. Nontender. Transverse infraumbilical incision well healed. No sign of recurrence or fluid collection.    Assessment:     Strangulated umbilical hernia, recovering uneventfully following emergent open repair with interrupted Novafil suture. No evidence of complication.    Plan:     May return to work, light duty only, starting tomorrow.  Return to all normal physical activities without restriction after May 6.  Return to see me if further problems arise. He was advised of a small but definite chance of hernia recurrence.   Angelia Mould. Derrell Lolling, M.D., [facility] General and Minimally invasive Surgery Breast and Colorectal Surgery Office:   [phone] Pager:   [phone]

## 2012-01-19 ENCOUNTER — Telehealth (INDEPENDENT_AMBULATORY_CARE_PROVIDER_SITE_OTHER): Payer: Self-pay | Admitting: General Surgery

## 2012-01-19 NOTE — Telephone Encounter (Signed)
Called back and left message for patient based on message left on 01/18/12.  Patient stated he was experiencing pain from his umbilical repair site and going on a cruise. No further information than that was received. Advised for patient to return call. Patient had surgery April 2013, beyond post op period.

## 2012-01-22 ENCOUNTER — Telehealth (INDEPENDENT_AMBULATORY_CARE_PROVIDER_SITE_OTHER): Payer: Self-pay | Admitting: General Surgery

## 2012-01-22 NOTE — Telephone Encounter (Signed)
Called back patient regarding message left last week. Answering machine did not pick up. Patient called regarding pain and repaired hernia site. Patient beyond post op period. Patient may need to be evaluated again after returning from cruise.

## 2012-12-30 ENCOUNTER — Other Ambulatory Visit: Payer: Self-pay | Admitting: Occupational Medicine

## 2012-12-30 ENCOUNTER — Ambulatory Visit: Payer: Self-pay

## 2012-12-30 DIAGNOSIS — R52 Pain, unspecified: Secondary | ICD-10-CM

## 2014-05-06 ENCOUNTER — Ambulatory Visit: Payer: Self-pay | Admitting: Orthopedic Surgery

## 2014-05-06 NOTE — Progress Notes (Signed)
Please put orders in Epic surgery 05-14-14 pre op 05-12-14 Thanks

## 2014-05-11 ENCOUNTER — Ambulatory Visit: Payer: Self-pay | Admitting: Orthopedic Surgery

## 2014-05-11 NOTE — H&P (Signed)
Noah Austin is an 58 y.o. male.   Chief Complaint: R shoulder pain HPI: The patient is a 58 year old male who presents for a Follow-up for Shoulder complaints. The patient is being followed for their right shoulder pain. They are 3 week(s) out from injury. Symptoms reported today include: pain. The patient feels that they are doing poorly and report their pain level to be moderate to severe and 8 / 10. Current treatment includes: pain medications. The following medication has been used for pain control: Oxycodone (5-325mg). The patient presents today following MRI. The patient has not gotten any relief of their symptoms with activity modification or conservative measures. The patient indicates that they have questions or concerns today regarding work duties.  Noah Austin follows up with his shoulder MRI. He has a complete tear of the supraspinatus with retraction, medial dislocation of the long head of the biceps, moderate AC arthrosis, full thickness tear of the supraspinatus, and of the subscap.  Past Medical History  Diagnosis Date  . Hypertension   . Dyslipidemia   . Obesity (BMI 35.0-39.9 without comorbidity)   . Hyperlipidemia   . Anemia     Past Surgical History  Procedure Laterality Date  . Left inguinal hernia      Repair about 27 years ago, about 25 yrs for 2nd surgery  . Umbilical hernia repair  07/17/2011    Procedure: HERNIA REPAIR UMBILICAL ADULT;  Surgeon: Haywood M Ingram, MD;  Location: [facility];  Service: General;  Laterality: N/A;  repair strangulated umbilical hernia  . Hernia repair  07/17/11    umbilical hernia    Family History  Problem Relation Age of Onset  . Cancer Maternal Grandmother     colon   Social History:  reports that he quit smoking about 25 years ago. He has never used smokeless tobacco. He reports that he does not drink alcohol or use illicit drugs.  Allergies:  Allergies  Allergen Reactions  . Codeine Itching     (Not in a hospital  admission)  No results found for this or any previous visit (from the past 48 hour(s)). No results found.  Review of Systems  Constitutional: Negative.   HENT: Negative.   Eyes: Negative.   Respiratory: Negative.   Cardiovascular: Negative.   Gastrointestinal: Negative.   Genitourinary: Negative.   Musculoskeletal: Positive for joint pain.  Skin: Negative.   Neurological: Negative.   Psychiatric/Behavioral: Negative.     There were no vitals taken for this visit. Physical Exam  Constitutional: He is oriented to person, place, and time. He appears well-developed and well-nourished.  HENT:  Head: Normocephalic and atraumatic.  Eyes: Conjunctivae and EOM are normal. Pupils are equal, round, and reactive to light.  Neck: Normal range of motion. Neck supple.  Cardiovascular: Normal rate and regular rhythm.   Respiratory: Effort normal and breath sounds normal.  GI: Soft. Bowel sounds are normal.  Musculoskeletal:  On exam he is unable to abduct. He is tender anterior subacromial region. Minimally tender over the AC.  Neurological: He is alert and oriented to person, place, and time. He has normal reflexes.  Skin: Skin is warm and dry.  Psychiatric: He has a normal mood and affect.     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   1. Discussed rotator cuff repair.  I had a long discussion with the patient concerning the risks and benefits of the proposed shoulder   surgery including need for rotator cuff repair, infection, suboptimal range of motion, adhesive capsulitis, and recurrent tear requiring further surgery. We also discussed the extended recovery requirement for postoperative physical therapy and the time to maximum recovery. I provided the patient with an illustrated handout and discussed that in detail. We also discussed anesthetic complications, DVT, PE, cardiopulmonary  dysfunction, etc.  2. Tenodesis or tenotomy of the biceps. 3. May not be able to repair the subscap, may require distal clavicle resection. 4. Discussed over night in the hospital, preoperative clearance, four to six weeks of passive range of motion followed by four to six weeks of active range of motion followed by progressive strengthening three to five months until maximum medial improvement, physical therapy for three to five months. 5. This is related to his work injury, continue with current work status.  Plan right shoulder mini-open RCR, bicep repair vs. tenodesis  Nathaneal Sommers M. PA-C for Dr. Beane 05/11/2014, 8:09 AM    

## 2014-05-12 ENCOUNTER — Encounter (HOSPITAL_COMMUNITY)
Admission: RE | Admit: 2014-05-12 | Discharge: 2014-05-12 | Disposition: A | Discharge status: Home / Self Care | Payer: 59 | Source: Ambulatory Visit | Attending: Specialist | Admitting: Specialist

## 2014-05-12 ENCOUNTER — Encounter (HOSPITAL_COMMUNITY): Payer: Self-pay

## 2014-05-12 ENCOUNTER — Ambulatory Visit (HOSPITAL_COMMUNITY)
Admission: RE | Admit: 2014-05-12 | Discharge: 2014-05-12 | Disposition: A | Discharge status: Home / Self Care | Payer: 59 | Source: Ambulatory Visit | Attending: Specialist | Admitting: Specialist

## 2014-05-12 DIAGNOSIS — Z01818 Encounter for other preprocedural examination: Secondary | ICD-10-CM | POA: Insufficient documentation

## 2014-05-12 LAB — BASIC METABOLIC PANEL
Anion gap: 10 (ref 5–15)
BUN: 24 mg/dL — AB (ref 6–23)
CALCIUM: 9.7 mg/dL (ref 8.4–10.5)
CO2: 27 mmol/L (ref 19–32)
Chloride: 101 mmol/L (ref 96–112)
Creatinine, Ser: 0.99 mg/dL (ref 0.50–1.35)
GFR calc non Af Amer: 87 mL/min — ABNORMAL LOW (ref >90)
Glucose, Bld: 92 mg/dL (ref 70–99)
Potassium: 3.3 mmol/L — ABNORMAL LOW (ref 3.5–5.1)
Sodium: 138 mmol/L (ref 135–145)

## 2014-05-12 LAB — CBC
HCT: 45.1 % (ref 39.0–52.0)
HEMOGLOBIN: 15.6 g/dL (ref 13.0–17.0)
MCH: 30.8 pg (ref 26.0–34.0)
MCHC: 34.6 g/dL (ref 30.0–36.0)
MCV: 89 fL (ref 78.0–100.0)
PLATELETS: 215 10*3/uL (ref 150–400)
RBC: 5.07 MIL/uL (ref 4.22–5.81)
RDW: 13.3 % (ref 11.5–15.5)
WBC: 7.5 10*3/uL (ref 4.0–10.5)

## 2014-05-12 NOTE — Progress Notes (Signed)
02/18/14 - LOV Dr Elias Else on chart ( PCP).

## 2014-05-12 NOTE — Patient Instructions (Signed)
Noah Austin  05/12/2014   Your procedure is scheduled on: 05/14/14    Report to [facility] Main  Entrance and follow signs to               Short Stay Center at     0530 AM.  Call this number if you have problems the morning of surgery [phone]   Remember:  Do not eat food or drink liquids :After Midnight.     Take these medicines the morning of surgery with A SIP OF WATER: none                                You may not have any metal on your body including hair pins and              piercings  Do not wear jewelry, , lotions, powders or perfumes., deodorant             .  Do not shave  48 hours prior to surgery.              Men may shave face and neck.   Do not bring valuables to the hospital. [facility] IS NOT             RESPONSIBLE   FOR VALUABLES.  Contacts, dentures or bridgework may not be worn into surgery.  Leave suitcase in the car. After surgery it may be brought to your room.     Patients discharged the day of surgery will not be allowed to drive home.  Name and phone number of your driver:  Special Instructions: N/A              Please read over the following fact sheets you were given: _____________________________________________________________________             [facility] - Preparing for Surgery Before surgery, you can play an important role.  Because skin is not sterile, your skin needs to be as free of germs as possible.  You can reduce the number of germs on your skin by washing with CHG (chlorahexidine gluconate) soap before surgery.  CHG is an antiseptic cleaner which kills germs and bonds with the skin to continue killing germs even after washing. Please DO NOT use if you have an allergy to CHG or antibacterial soaps.  If your skin becomes reddened/irritated stop using the CHG and inform your nurse when you arrive at Short Stay. Do not shave (including legs and underarms) for at least 48 hours prior to the first CHG  shower.  You may shave your face/neck. Please follow these instructions carefully:  1.  Shower with CHG Soap the night before surgery and the  morning of Surgery.  2.  If you choose to wash your hair, wash your hair first as usual with your  normal  shampoo.  3.  After you shampoo, rinse your hair and body thoroughly to remove the  shampoo.                           4.  Use CHG as you would any other liquid soap.  You can apply chg directly  to the skin and wash  Gently with a scrungie or clean washcloth.  5.  Apply the CHG Soap to your body ONLY FROM THE NECK DOWN.   Do not use on face/ open                           Wound or open sores. Avoid contact with eyes, ears mouth and genitals (private parts).                       Wash face,  Genitals (private parts) with your normal soap.             6.  Wash thoroughly, paying special attention to the area where your surgery  will be performed.  7.  Thoroughly rinse your body with warm water from the neck down.  8.  DO NOT shower/wash with your normal soap after using and rinsing off  the CHG Soap.                9.  Pat yourself dry with a clean towel.            10.  Wear clean pajamas.            11.  Place clean sheets on your bed the night of your first shower and do not  sleep with pets. Day of Surgery : Do not apply any lotions/deodorants the morning of surgery.  Please wear clean clothes to the hospital/surgery center.  FAILURE TO FOLLOW THESE INSTRUCTIONS MAY RESULT IN THE CANCELLATION OF YOUR SURGERY PATIENT SIGNATURE_________________________________  NURSE SIGNATURE__________________________________  ________________________________________________________________________

## 2014-05-12 NOTE — Progress Notes (Signed)
Your patient has scored a 6 on the Stop Bang Assessment Tool for Obstructive Sleep Apnea on preop appointment for upcoming surgery on 05/14/2014.  This patient is at an elevated risk for Obstructive Sleep Apnea according to this tool

## 2014-05-13 NOTE — Progress Notes (Signed)
Final EKG done 05/12/14 EPIC .

## 2014-05-14 ENCOUNTER — Encounter (HOSPITAL_COMMUNITY): Payer: Self-pay | Admitting: *Deleted

## 2014-05-14 ENCOUNTER — Ambulatory Visit (HOSPITAL_COMMUNITY): Payer: 59 | Admitting: Anesthesiology

## 2014-05-14 ENCOUNTER — Encounter (HOSPITAL_COMMUNITY)
Admission: RE | Disposition: A | Discharge status: Home / Self Care | Payer: Self-pay | Source: Ambulatory Visit | Attending: Specialist

## 2014-05-14 ENCOUNTER — Ambulatory Visit (HOSPITAL_COMMUNITY)
Admission: RE | Admit: 2014-05-14 | Discharge: 2014-05-15 | Disposition: A | Discharge status: Home / Self Care | Payer: 59 | Source: Ambulatory Visit | Attending: Specialist | Admitting: Specialist

## 2014-05-14 DIAGNOSIS — S46211A Strain of muscle, fascia and tendon of other parts of biceps, right arm, initial encounter: Secondary | ICD-10-CM | POA: Diagnosis not present

## 2014-05-14 DIAGNOSIS — M75101 Unspecified rotator cuff tear or rupture of right shoulder, not specified as traumatic: Secondary | ICD-10-CM | POA: Diagnosis present

## 2014-05-14 DIAGNOSIS — Y9389 Activity, other specified: Secondary | ICD-10-CM | POA: Diagnosis not present

## 2014-05-14 DIAGNOSIS — Y998 Other external cause status: Secondary | ICD-10-CM | POA: Diagnosis not present

## 2014-05-14 DIAGNOSIS — Z885 Allergy status to narcotic agent status: Secondary | ICD-10-CM | POA: Diagnosis not present

## 2014-05-14 DIAGNOSIS — X58XXXA Exposure to other specified factors, initial encounter: Secondary | ICD-10-CM | POA: Insufficient documentation

## 2014-05-14 DIAGNOSIS — Z87891 Personal history of nicotine dependence: Secondary | ICD-10-CM | POA: Insufficient documentation

## 2014-05-14 DIAGNOSIS — Z6835 Body mass index (BMI) 35.0-35.9, adult: Secondary | ICD-10-CM | POA: Insufficient documentation

## 2014-05-14 DIAGNOSIS — M7541 Impingement syndrome of right shoulder: Secondary | ICD-10-CM | POA: Insufficient documentation

## 2014-05-14 DIAGNOSIS — Y9289 Other specified places as the place of occurrence of the external cause: Secondary | ICD-10-CM | POA: Diagnosis not present

## 2014-05-14 DIAGNOSIS — E785 Hyperlipidemia, unspecified: Secondary | ICD-10-CM | POA: Diagnosis not present

## 2014-05-14 DIAGNOSIS — M75121 Complete rotator cuff tear or rupture of right shoulder, not specified as traumatic: Secondary | ICD-10-CM | POA: Diagnosis not present

## 2014-05-14 DIAGNOSIS — I10 Essential (primary) hypertension: Secondary | ICD-10-CM | POA: Diagnosis not present

## 2014-05-14 DIAGNOSIS — M75120 Complete rotator cuff tear or rupture of unspecified shoulder, not specified as traumatic: Secondary | ICD-10-CM | POA: Diagnosis present

## 2014-05-14 HISTORY — PX: SHOULDER OPEN ROTATOR CUFF REPAIR: SHX2407

## 2014-05-14 SURGERY — REPAIR, ROTATOR CUFF, OPEN
Anesthesia: General | Site: Shoulder | Laterality: Right

## 2014-05-14 MED ORDER — ZOLPIDEM TARTRATE 10 MG PO TABS
10.0000 mg | ORAL_TABLET | Freq: Every day | ORAL | Status: DC
  Administered 2014-05-14: 10 mg via ORAL
  Filled 2014-05-14: qty 1

## 2014-05-14 MED ORDER — METHOCARBAMOL 1000 MG/10ML IJ SOLN
500.0000 mg | Freq: Four times a day (QID) | INTRAMUSCULAR | Status: DC | PRN
  Administered 2014-05-14: 500 mg via INTRAVENOUS
  Filled 2014-05-14 (×2): qty 5

## 2014-05-14 MED ORDER — OXYCODONE-ACETAMINOPHEN 5-325 MG PO TABS
ORAL_TABLET | ORAL | Status: AC
  Filled 2014-05-14: qty 2

## 2014-05-14 MED ORDER — CEFAZOLIN SODIUM-DEXTROSE 2-3 GM-% IV SOLR
2.0000 g | Freq: Four times a day (QID) | INTRAVENOUS | Status: AC
  Administered 2014-05-14 – 2014-05-15 (×2): 2 g via INTRAVENOUS
  Filled 2014-05-14 (×2): qty 50

## 2014-05-14 MED ORDER — LISINOPRIL-HYDROCHLOROTHIAZIDE 20-25 MG PO TABS: 1.0000 | ORAL_TABLET | Freq: Every day | ORAL | Status: DC

## 2014-05-14 MED ORDER — GLYCOPYRROLATE 0.2 MG/ML IJ SOLN
INTRAMUSCULAR | Status: AC
  Filled 2014-05-14: qty 4

## 2014-05-14 MED ORDER — SODIUM CHLORIDE 0.9 % IR SOLN
Status: AC
  Filled 2014-05-14: qty 1

## 2014-05-14 MED ORDER — DEXTROSE 5 % IV SOLN
3.0000 g | Freq: Four times a day (QID) | INTRAVENOUS | Status: DC
  Administered 2014-05-14: 3 g via INTRAVENOUS
  Filled 2014-05-14 (×2): qty 3000

## 2014-05-14 MED ORDER — ONDANSETRON HCL 4 MG/2ML IJ SOLN: 4.0000 mg | Freq: Four times a day (QID) | INTRAMUSCULAR | Status: DC | PRN

## 2014-05-14 MED ORDER — ONDANSETRON HCL 4 MG/2ML IJ SOLN
INTRAMUSCULAR | Status: AC
  Filled 2014-05-14: qty 2

## 2014-05-14 MED ORDER — DOCUSATE SODIUM 100 MG PO CAPS
100.0000 mg | ORAL_CAPSULE | Freq: Two times a day (BID) | ORAL | Status: DC
  Administered 2014-05-14 – 2014-05-15 (×2): 100 mg via ORAL

## 2014-05-14 MED ORDER — HYDROMORPHONE HCL 1 MG/ML IJ SOLN
0.2500 mg | INTRAMUSCULAR | Status: DC | PRN
  Administered 2014-05-14 (×2): 0.5 mg via INTRAVENOUS

## 2014-05-14 MED ORDER — CISATRACURIUM BESYLATE (PF) 10 MG/5ML IV SOLN
INTRAVENOUS | Status: DC | PRN
  Administered 2014-05-14: 8 mg via INTRAVENOUS
  Administered 2014-05-14: 2 mg via INTRAVENOUS

## 2014-05-14 MED ORDER — MENTHOL 3 MG MT LOZG
1.0000 | LOZENGE | OROMUCOSAL | Status: DC | PRN
  Filled 2014-05-14: qty 9

## 2014-05-14 MED ORDER — METHOCARBAMOL 500 MG PO TABS
500.0000 mg | ORAL_TABLET | Freq: Four times a day (QID) | ORAL | Status: DC | PRN
  Administered 2014-05-14 – 2014-05-15 (×4): 500 mg via ORAL
  Filled 2014-05-14 (×4): qty 1

## 2014-05-14 MED ORDER — BISACODYL 5 MG PO TBEC: 5.0000 mg | DELAYED_RELEASE_TABLET | Freq: Every day | ORAL | Status: DC | PRN

## 2014-05-14 MED ORDER — GLYCOPYRROLATE 0.2 MG/ML IJ SOLN
INTRAMUSCULAR | Status: DC | PRN
  Administered 2014-05-14: .8 mg via INTRAVENOUS

## 2014-05-14 MED ORDER — FENTANYL CITRATE 0.05 MG/ML IJ SOLN
INTRAMUSCULAR | Status: AC
  Filled 2014-05-14: qty 5

## 2014-05-14 MED ORDER — SENNOSIDES-DOCUSATE SODIUM 8.6-50 MG PO TABS: 1.0000 | ORAL_TABLET | Freq: Every evening | ORAL | Status: DC | PRN

## 2014-05-14 MED ORDER — PHENOL 1.4 % MT LIQD
1.0000 | OROMUCOSAL | Status: DC | PRN
  Filled 2014-05-14: qty 177

## 2014-05-14 MED ORDER — PRAVASTATIN SODIUM 40 MG PO TABS
40.0000 mg | ORAL_TABLET | Freq: Every day | ORAL | Status: DC
  Administered 2014-05-14: 40 mg via ORAL
  Filled 2014-05-14 (×2): qty 1

## 2014-05-14 MED ORDER — HYDROMORPHONE HCL 1 MG/ML IJ SOLN
0.2500 mg | INTRAMUSCULAR | Status: DC | PRN
  Administered 2014-05-14 (×4): 0.5 mg via INTRAVENOUS

## 2014-05-14 MED ORDER — MEPERIDINE HCL 50 MG/ML IJ SOLN: 6.2500 mg | INTRAMUSCULAR | Status: DC | PRN

## 2014-05-14 MED ORDER — ONDANSETRON HCL 4 MG PO TABS: 4.0000 mg | ORAL_TABLET | Freq: Four times a day (QID) | ORAL | Status: DC | PRN

## 2014-05-14 MED ORDER — HYDROMORPHONE HCL 1 MG/ML IJ SOLN
INTRAMUSCULAR | Status: AC
  Filled 2014-05-14: qty 1

## 2014-05-14 MED ORDER — SUCCINYLCHOLINE CHLORIDE 20 MG/ML IJ SOLN
INTRAMUSCULAR | Status: DC | PRN
  Administered 2014-05-14: 100 mg via INTRAVENOUS

## 2014-05-14 MED ORDER — METOCLOPRAMIDE HCL 5 MG/ML IJ SOLN: 5.0000 mg | Freq: Three times a day (TID) | INTRAMUSCULAR | Status: DC | PRN

## 2014-05-14 MED ORDER — METHOCARBAMOL 500 MG PO TABS: 500.0000 mg | ORAL_TABLET | Freq: Three times a day (TID) | ORAL | Status: DC | PRN

## 2014-05-14 MED ORDER — ENOXAPARIN SODIUM 40 MG/0.4ML ~~LOC~~ SOLN
40.0000 mg | SUBCUTANEOUS | Status: DC
  Administered 2014-05-15: 40 mg via SUBCUTANEOUS
  Filled 2014-05-14 (×2): qty 0.4

## 2014-05-14 MED ORDER — KCL IN DEXTROSE-NACL 20-5-0.45 MEQ/L-%-% IV SOLN
INTRAVENOUS | Status: AC
  Administered 2014-05-14 – 2014-05-15 (×2): via INTRAVENOUS
  Filled 2014-05-14 (×3): qty 1000

## 2014-05-14 MED ORDER — ONDANSETRON HCL 4 MG/2ML IJ SOLN
INTRAMUSCULAR | Status: DC | PRN
  Administered 2014-05-14: 4 mg via INTRAVENOUS

## 2014-05-14 MED ORDER — PROPOFOL 10 MG/ML IV BOLUS
INTRAVENOUS | Status: DC | PRN
  Administered 2014-05-14: 200 mg via INTRAVENOUS

## 2014-05-14 MED ORDER — DOCUSATE SODIUM 100 MG PO CAPS: 100.0000 mg | ORAL_CAPSULE | Freq: Two times a day (BID) | ORAL | Status: DC | PRN

## 2014-05-14 MED ORDER — NEOSTIGMINE METHYLSULFATE 10 MG/10ML IV SOLN
INTRAVENOUS | Status: DC | PRN
  Administered 2014-05-14: 5 mg via INTRAVENOUS

## 2014-05-14 MED ORDER — PHENYLEPHRINE HCL 10 MG/ML IJ SOLN
10.0000 mg | INTRAVENOUS | Status: DC | PRN
  Administered 2014-05-14: 50 ug/min via INTRAVENOUS

## 2014-05-14 MED ORDER — MIDAZOLAM HCL 5 MG/5ML IJ SOLN
INTRAMUSCULAR | Status: DC | PRN
  Administered 2014-05-14: 2 mg via INTRAVENOUS

## 2014-05-14 MED ORDER — METOCLOPRAMIDE HCL 5 MG/ML IJ SOLN
INTRAMUSCULAR | Status: AC
  Filled 2014-05-14: qty 2

## 2014-05-14 MED ORDER — EPHEDRINE SULFATE 50 MG/ML IJ SOLN
INTRAMUSCULAR | Status: AC
  Filled 2014-05-14: qty 1

## 2014-05-14 MED ORDER — SODIUM CHLORIDE 0.9 % IR SOLN
Status: DC | PRN
  Administered 2014-05-14: 500 mL

## 2014-05-14 MED ORDER — LISINOPRIL 20 MG PO TABS
20.0000 mg | ORAL_TABLET | Freq: Every day | ORAL | Status: DC
  Administered 2014-05-14: 20 mg via ORAL
  Filled 2014-05-14 (×2): qty 1

## 2014-05-14 MED ORDER — METOCLOPRAMIDE HCL 5 MG PO TABS
5.0000 mg | ORAL_TABLET | Freq: Three times a day (TID) | ORAL | Status: DC | PRN
  Filled 2014-05-14: qty 2

## 2014-05-14 MED ORDER — PROMETHAZINE HCL 25 MG/ML IJ SOLN: 6.2500 mg | INTRAMUSCULAR | Status: DC | PRN

## 2014-05-14 MED ORDER — HYDROMORPHONE HCL 1 MG/ML IJ SOLN
0.5000 mg | INTRAMUSCULAR | Status: DC | PRN
  Administered 2014-05-14 – 2014-05-15 (×3): 1 mg via INTRAVENOUS
  Filled 2014-05-14 (×3): qty 1

## 2014-05-14 MED ORDER — MIDAZOLAM HCL 2 MG/2ML IJ SOLN
INTRAMUSCULAR | Status: AC
  Filled 2014-05-14: qty 2

## 2014-05-14 MED ORDER — OXYCODONE-ACETAMINOPHEN 5-325 MG PO TABS: 1.0000 | ORAL_TABLET | ORAL | Status: DC | PRN

## 2014-05-14 MED ORDER — HYDROCHLOROTHIAZIDE 25 MG PO TABS
25.0000 mg | ORAL_TABLET | Freq: Every day | ORAL | Status: DC
  Administered 2014-05-14: 25 mg via ORAL
  Filled 2014-05-14 (×2): qty 1

## 2014-05-14 MED ORDER — CISATRACURIUM BESYLATE 20 MG/10ML IV SOLN
INTRAVENOUS | Status: AC
  Filled 2014-05-14: qty 10

## 2014-05-14 MED ORDER — METOCLOPRAMIDE HCL 5 MG/ML IJ SOLN
INTRAMUSCULAR | Status: DC | PRN
  Administered 2014-05-14: 10 mg via INTRAVENOUS

## 2014-05-14 MED ORDER — PROPOFOL 10 MG/ML IV BOLUS
INTRAVENOUS | Status: AC
  Filled 2014-05-14: qty 20

## 2014-05-14 MED ORDER — FENTANYL CITRATE 0.05 MG/ML IJ SOLN
INTRAMUSCULAR | Status: DC | PRN
  Administered 2014-05-14: 50 ug via INTRAVENOUS
  Administered 2014-05-14: 100 ug via INTRAVENOUS

## 2014-05-14 MED ORDER — BUPIVACAINE-EPINEPHRINE 0.5% -1:200000 IJ SOLN
INTRAMUSCULAR | Status: DC | PRN
  Administered 2014-05-14: 20 mL

## 2014-05-14 MED ORDER — SODIUM CHLORIDE 0.9 % IJ SOLN
INTRAMUSCULAR | Status: AC
  Filled 2014-05-14: qty 10

## 2014-05-14 MED ORDER — PHENYLEPHRINE HCL 10 MG/ML IJ SOLN
INTRAMUSCULAR | Status: AC
  Filled 2014-05-14: qty 1

## 2014-05-14 MED ORDER — LACTATED RINGERS IV SOLN: INTRAVENOUS | Status: DC

## 2014-05-14 MED ORDER — ACETAMINOPHEN 325 MG PO TABS
650.0000 mg | ORAL_TABLET | Freq: Four times a day (QID) | ORAL | Status: DC | PRN
  Administered 2014-05-15: 650 mg via ORAL
  Filled 2014-05-14: qty 2

## 2014-05-14 MED ORDER — DEXAMETHASONE SODIUM PHOSPHATE 10 MG/ML IJ SOLN
INTRAMUSCULAR | Status: AC
  Filled 2014-05-14: qty 1

## 2014-05-14 MED ORDER — OXYCODONE-ACETAMINOPHEN 5-325 MG PO TABS
1.0000 | ORAL_TABLET | ORAL | Status: DC | PRN
  Administered 2014-05-14 – 2014-05-15 (×6): 2 via ORAL
  Filled 2014-05-14: qty 1
  Filled 2014-05-14 (×5): qty 2

## 2014-05-14 MED ORDER — ACETAMINOPHEN 650 MG RE SUPP: 650.0000 mg | Freq: Four times a day (QID) | RECTAL | Status: DC | PRN

## 2014-05-14 MED ORDER — BUPIVACAINE-EPINEPHRINE (PF) 0.5% -1:200000 IJ SOLN
INTRAMUSCULAR | Status: AC
  Filled 2014-05-14: qty 60

## 2014-05-14 MED ORDER — DEXAMETHASONE SODIUM PHOSPHATE 10 MG/ML IJ SOLN
INTRAMUSCULAR | Status: DC | PRN
  Administered 2014-05-14: 10 mg via INTRAVENOUS

## 2014-05-14 MED ORDER — BUPIVACAINE HCL (PF) 0.5 % IJ SOLN
INTRAMUSCULAR | Status: AC
  Filled 2014-05-14: qty 60

## 2014-05-14 MED ORDER — LACTATED RINGERS IV SOLN
INTRAVENOUS | Status: DC
  Administered 2014-05-14 (×2): via INTRAVENOUS

## 2014-05-14 MED ORDER — DEXTROSE 5 % IV SOLN
3.0000 g | INTRAVENOUS | Status: AC
  Administered 2014-05-14: 3 g via INTRAVENOUS
  Filled 2014-05-14: qty 3000

## 2014-05-14 SURGICAL SUPPLY — 66 items
ANCH SUT PUSHLCK 24X4.5 STRL (Orthopedic Implant) ×2 IMPLANT
ANCH SUT SWLK 19.1X4.75 VT (Anchor) ×2 IMPLANT
ANCHOR NDL 9/16 CIR SZ 8 (NEEDLE) IMPLANT
ANCHOR NEEDLE 9/16 CIR SZ 8 (NEEDLE) IMPLANT
ANCHOR PEEK 4.75X19.1 SWLK C (Anchor) ×4 IMPLANT
BAG SPEC THK2 15X12 ZIP CLS (MISCELLANEOUS) ×1
BAG ZIPLOCK 12X15 (MISCELLANEOUS) ×3 IMPLANT
BLADE OSCILLATING/SAGITTAL (BLADE) ×3
BLADE SW THK.38XMED LNG THN (BLADE) ×1 IMPLANT
CHLORAPREP W/TINT 26ML (MISCELLANEOUS) IMPLANT
CLEANER TIP ELECTROSURG 2X2 (MISCELLANEOUS) ×3 IMPLANT
CLOSURE WOUND 1/2 X4 (GAUZE/BANDAGES/DRESSINGS) ×1
CLOTH 2% CHLOROHEXIDINE 3PK (PERSONAL CARE ITEMS) ×3 IMPLANT
COVER MAYO STAND STRL (DRAPES) ×3 IMPLANT
DECANTER SPIKE VIAL GLASS SM (MISCELLANEOUS) ×1 IMPLANT
DRAPE ORTHO SPLIT 77X108 STRL (DRAPES) ×3
DRAPE POUCH INSTRU U-SHP 10X18 (DRAPES) ×3 IMPLANT
DRAPE SURG 17X11 SM STRL (DRAPES) ×3 IMPLANT
DRAPE SURG ORHT 6 SPLT 77X108 (DRAPES) ×1 IMPLANT
DRAPE U-SHAPE 47X51 STRL (DRAPES) ×3 IMPLANT
DRSG AQUACEL AG ADV 3.5X 4 (GAUZE/BANDAGES/DRESSINGS) ×2 IMPLANT
DRSG AQUACEL AG ADV 3.5X 6 (GAUZE/BANDAGES/DRESSINGS) IMPLANT
DRSG EMULSION OIL 3X3 NADH (GAUZE/BANDAGES/DRESSINGS) ×3 IMPLANT
DURAPREP 26ML APPLICATOR (WOUND CARE) ×3 IMPLANT
ELECT NDL TIP 2.8 STRL (NEEDLE) ×1 IMPLANT
ELECT NEEDLE TIP 2.8 STRL (NEEDLE) ×3 IMPLANT
ELECT REM PT RETURN 9FT ADLT (ELECTROSURGICAL) ×3
ELECTRODE REM PT RTRN 9FT ADLT (ELECTROSURGICAL) ×1 IMPLANT
GLOVE BIOGEL PI IND STRL 7.5 (GLOVE) ×1 IMPLANT
GLOVE BIOGEL PI IND STRL 8 (GLOVE) ×1 IMPLANT
GLOVE BIOGEL PI INDICATOR 7.5 (GLOVE) ×2
GLOVE BIOGEL PI INDICATOR 8 (GLOVE) ×2
GLOVE SURG SS PI 7.5 STRL IVOR (GLOVE) ×3 IMPLANT
GLOVE SURG SS PI 8.0 STRL IVOR (GLOVE) ×6 IMPLANT
GOWN STRL REUS W/ TWL XL LVL3 (GOWN DISPOSABLE) ×1 IMPLANT
GOWN STRL REUS W/TWL LRG LVL3 (GOWN DISPOSABLE) ×3 IMPLANT
GOWN STRL REUS W/TWL XL LVL3 (GOWN DISPOSABLE) ×9 IMPLANT
KIT BASIN OR (CUSTOM PROCEDURE TRAY) ×3 IMPLANT
KIT POSITION SHOULDER SCHLEI (MISCELLANEOUS) ×3 IMPLANT
MANIFOLD NEPTUNE II (INSTRUMENTS) ×3 IMPLANT
NDL SCORPION MULTI FIRE (NEEDLE) IMPLANT
NEEDLE SCORPION MULTI FIRE (NEEDLE) ×3 IMPLANT
PACK SHOULDER (CUSTOM PROCEDURE TRAY) ×3 IMPLANT
POSITIONER SURGICAL ARM (MISCELLANEOUS) ×3 IMPLANT
PUSHLOCK PEEK 4.5X24 (Orthopedic Implant) ×5 IMPLANT
SLING ARM IMMOBILIZER LRG (SOFTGOODS) ×3 IMPLANT
SLING ARM XL FOAM STRAP (SOFTGOODS) ×2 IMPLANT
SLING ULTRA II L (ORTHOPEDIC SUPPLIES) IMPLANT
STRIP CLOSURE SKIN 1/2X4 (GAUZE/BANDAGES/DRESSINGS) ×2 IMPLANT
SUT BONE WAX W31G (SUTURE) IMPLANT
SUT ETHIBOND 0 (SUTURE) ×6 IMPLANT
SUT ETHIBOND 2 OS 4 DA (SUTURE) ×3 IMPLANT
SUT ETHIBOND NAB CT1 #1 30IN (SUTURE) IMPLANT
SUT FIBERWIRE #2 38 T-5 BLUE (SUTURE)
SUT FIBERWIRE 2-0 18 17.9 3/8 (SUTURE)
SUT PROLENE 3 0 PS 2 (SUTURE) ×3 IMPLANT
SUT TIGER TAPE 7 IN WHITE (SUTURE) ×4 IMPLANT
SUT VIC AB 1 CT1 27 (SUTURE)
SUT VIC AB 1 CT1 27XBRD ANTBC (SUTURE) ×2 IMPLANT
SUT VIC AB 1-0 CT2 27 (SUTURE) ×4 IMPLANT
SUT VIC AB 2-0 CT2 27 (SUTURE) ×3 IMPLANT
SUT VICRYL 0 UR6 27IN ABS (SUTURE) ×1 IMPLANT
SUT VICRYL 0-0 OS 2 NEEDLE (SUTURE) ×6 IMPLANT
SUTURE FIBERWR #2 38 T-5 BLUE (SUTURE) IMPLANT
SUTURE FIBERWR 2-0 18 17.9 3/8 (SUTURE) ×2 IMPLANT
TAPE FIBER 2MM 7IN #2 BLUE (SUTURE) ×4 IMPLANT

## 2014-05-14 NOTE — Interval H&P Note (Signed)
History and Physical Interval Note:  05/14/2014 7:30 AM  Noah Austin  has presented today for surgery, with the diagnosis of ROTATOR CUFF TEAR AND BICEP TEAR ON RIGHT   The various methods of treatment have been discussed with the patient and family. After consideration of risks, benefits and other options for treatment, the patient has consented to  Procedure(s): RIGHT SHOULDER MINI OPEN ROTATOR CUFF REPAIR  (Right) BICEPS REPAIR OR TENODESIS (Right) as a surgical intervention .  The patient's history has been reviewed, patient examined, no change in status, stable for surgery.  I have reviewed the patient's chart and labs.  Questions were answered to the patient's satisfaction.     Arnell Slivinski C

## 2014-05-14 NOTE — Transfer of Care (Signed)
Immediate Anesthesia Transfer of Care Note  Patient: Noah Austin  Procedure(s) Performed: Procedure(s): RIGHT SHOULDER MINI OPEN ROTATOR CUFF REPAIR  (Right)  Patient Location: PACU  Anesthesia Type:General  Level of Consciousness: awake, sedated and patient cooperative  Airway & Oxygen Therapy: Patient Spontanous Breathing and Patient connected to face mask oxygen  Post-op Assessment: Report given to PACU RN and Post -op Vital signs reviewed and stable  Post vital signs: Reviewed and stable  Last Vitals:  Filed Vitals:   05/14/14 0529  BP: 134/80  Pulse: 89  Temp: 36.6 C  Resp: 18    Complications: No apparent anesthesia complications

## 2014-05-14 NOTE — Discharge Instructions (Signed)
Aquacel dressing may remain in place until follow up. May shower with aquacel dressing in place. If the dressing becomes saturated or peels off, you may remove it and place a new dressing with gauze and tape which should be kept clean and dry and changed daily. °Use sling at times except when exercising or showering °No driving for 4-6 weeks °No lifting for 6 weeks operative arm °Pendulum exercises as instructed. °Ok to move wrist, elbow, and hand. °See Dr. Paulette Rockford in 10-14 days. Take one aspirin per day with a meal 325mg  if not on a blood thinner or allergic to aspirin. °

## 2014-05-14 NOTE — Anesthesia Postprocedure Evaluation (Signed)
  Anesthesia Post-op Note  Patient: Noah Austin  Procedure(s) Performed: Procedure(s) (LRB): RIGHT SHOULDER MINI OPEN ROTATOR CUFF REPAIR  (Right)  Patient Location: PACU  Anesthesia Type: General  Level of Consciousness: awake and alert   Airway and Oxygen Therapy: Patient Spontanous Breathing  Post-op Pain: mild  Post-op Assessment: Post-op Vital signs reviewed, Patient's Cardiovascular Status Stable, Respiratory Function Stable, Patent Airway and No signs of Nausea or vomiting  Last Vitals:  Filed Vitals:   05/14/14 1249  BP: 118/59  Pulse: 90  Temp: 36.8 C  Resp: 16    Post-op Vital Signs: stable   Complications: No apparent anesthesia complications

## 2014-05-14 NOTE — Brief Op Note (Signed)
05/14/2014  8:46 AM  PATIENT:  [name]  59 y.o. male  PRE-OPERATIVE DIAGNOSIS:  ROTATOR CUFF TEAR AND BICEP TEAR ON RIGHT   POST-OPERATIVE DIAGNOSIS:  ROTATOR CUFF TEAR AND BICEP TEAR ON RIGHT   PROCEDURE:  Procedure(s): RIGHT SHOULDER MINI OPEN ROTATOR CUFF REPAIR  (Right)  SURGEON:  Surgeon(s) and Role:    * Javier Docker, MD - Primary  PHYSICIAN ASSISTANT:   ASSISTANTS: Bissell   ANESTHESIA:   general  EBL:  Total I/O In: 1000 [I.V.:1000] Out: -   BLOOD ADMINISTERED:none  DRAINS: none   LOCAL MEDICATIONS USED:  MARCAINE     SPECIMEN:  No Specimen  DISPOSITION OF SPECIMEN:  N/A  COUNTS:  YES  TOURNIQUET:  * No tourniquets in log *  DICTATION: .Other Dictation: Dictation Number [id number]  PLAN OF CARE: Admit for overnight observation  PATIENT DISPOSITION:  PACU - hemodynamically stable.   Delay start of Pharmacological VTE agent (>24hrs) due to surgical blood loss or risk of bleeding: no

## 2014-05-14 NOTE — H&P (View-Only) (Signed)
Noah Austin is an 59 y.o. male.   Chief Complaint: R shoulder pain HPI: The patient is a 59 year old male who presents for a Follow-up for Shoulder complaints. The patient is being followed for their right shoulder pain. They are 3 week(s) out from injury. Symptoms reported today include: pain. The patient feels that they are doing poorly and report their pain level to be moderate to severe and 8 / 10. Current treatment includes: pain medications. The following medication has been used for pain control: Oxycodone (5-325mg ). The patient presents today following MRI. The patient has not gotten any relief of their symptoms with activity modification or conservative measures. The patient indicates that they have questions or concerns today regarding work duties.  Noah Austin follows up with his shoulder MRI. He has a complete tear of the supraspinatus with retraction, medial dislocation of the long head of the biceps, moderate AC arthrosis, full thickness tear of the supraspinatus, and of the subscap.  Past Medical History  Diagnosis Date  . Hypertension   . Dyslipidemia   . Obesity (BMI 35.0-39.9 without comorbidity)   . Hyperlipidemia   . Anemia     Past Surgical History  Procedure Laterality Date  . Left inguinal hernia      Repair about 27 years ago, about 25 yrs for 2nd surgery  . Umbilical hernia repair  07/17/2011    Procedure: HERNIA REPAIR UMBILICAL ADULT;  Surgeon: Ernestene Mention, MD;  Location: [facility];  Service: General;  Laterality: N/A;  repair strangulated umbilical hernia  . Hernia repair  07/17/11    umbilical hernia    Family History  Problem Relation Age of Onset  . Cancer Maternal Grandmother     colon   Social History:  reports that he quit smoking about 25 years ago. He has never used smokeless tobacco. He reports that he does not drink alcohol or use illicit drugs.  Allergies:  Allergies  Allergen Reactions  . Codeine Itching     (Not in a hospital  admission)  No results found for this or any previous visit (from the past 48 hour(s)). No results found.  Review of Systems  Constitutional: Negative.   HENT: Negative.   Eyes: Negative.   Respiratory: Negative.   Cardiovascular: Negative.   Gastrointestinal: Negative.   Genitourinary: Negative.   Musculoskeletal: Positive for joint pain.  Skin: Negative.   Neurological: Negative.   Psychiatric/Behavioral: Negative.     There were no vitals taken for this visit. Physical Exam  Constitutional: He is oriented to person, place, and time. He appears well-developed and well-nourished.  HENT:  Head: Normocephalic and atraumatic.  Eyes: Conjunctivae and EOM are normal. Pupils are equal, round, and reactive to light.  Neck: Normal range of motion. Neck supple.  Cardiovascular: Normal rate and regular rhythm.   Respiratory: Effort normal and breath sounds normal.  GI: Soft. Bowel sounds are normal.  Musculoskeletal:  On exam he is unable to abduct. He is tender anterior subacromial region. Minimally tender over the [facility].  Neurological: He is alert and oriented to person, place, and time. He has normal reflexes.  Skin: Skin is warm and dry.  Psychiatric: He has a normal mood and affect.     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   1. Discussed rotator cuff repair.  I had a long discussion with the patient concerning the risks and benefits of the proposed shoulder  surgery including need for rotator cuff repair, infection, suboptimal range of motion, adhesive capsulitis, and recurrent tear requiring further surgery. We also discussed the extended recovery requirement for postoperative physical therapy and the time to maximum recovery. I provided the patient with an illustrated handout and discussed that in detail. We also discussed anesthetic complications, DVT, PE, cardiopulmonary  dysfunction, etc.  2. Tenodesis or tenotomy of the biceps. 3. May not be able to repair the subscap, may require distal clavicle resection. 4. Discussed over night in the hospital, preoperative clearance, four to six weeks of passive range of motion followed by four to six weeks of active range of motion followed by progressive strengthening three to five months until maximum medial improvement, physical therapy for three to five months. 5. This is related to his work injury, continue with current work status.  Plan right shoulder mini-open RCR, bicep repair vs. tenodesis  BISSELL, JACLYN M. PA-C for Dr. Shelle Iron 05/11/2014, 8:09 AM

## 2014-05-14 NOTE — Anesthesia Preprocedure Evaluation (Signed)
Anesthesia Evaluation  Patient identified by MRN, date of birth, ID band Patient awake    Reviewed: Allergy & Precautions, H&P , NPO status , Patient's Chart, lab work & pertinent test results  Airway Mallampati: II  TM Distance: >3 FB Neck ROM: Full    Dental  (+) Teeth Intact, Partial Upper, Dental Advisory Given, Poor Dentition   Pulmonary neg pulmonary ROS, former smoker,  breath sounds clear to auscultation  Pulmonary exam normal       Cardiovascular hypertension, Pt. on medications Rhythm:Regular Rate:Normal     Neuro/Psych negative neurological ROS  negative psych ROS   GI/Hepatic negative GI ROS, Neg liver ROS,   Endo/Other  negative endocrine ROSMorbid obesity  Renal/GU negative Renal ROS  negative genitourinary   Musculoskeletal negative musculoskeletal ROS (+)   Abdominal (+) + obese,   Peds negative pediatric ROS (+)  Hematology negative hematology ROS (+)   Anesthesia Other Findings   Reproductive/Obstetrics negative OB ROS                             Anesthesia Physical  Anesthesia Plan  ASA: III and emergent  Anesthesia Plan: General   Post-op Pain Management:    Induction: Intravenous, Cricoid pressure planned and Rapid sequence  Airway Management Planned: Oral ETT  Additional Equipment:   Intra-op Plan:   Post-operative Plan: Extubation in OR  Informed Consent: I have reviewed the patients History and Physical, chart, labs and discussed the procedure including the risks, benefits and alternatives for the proposed anesthesia with the patient or authorized representative who has indicated his/her understanding and acceptance.   Dental advisory given  Plan Discussed with: CRNA  Anesthesia Plan Comments:         Anesthesia Quick Evaluation

## 2014-05-14 NOTE — Anesthesia Procedure Notes (Signed)
Procedure Name: Intubation Date/Time: 05/14/2014 7:43 AM Performed by: Paulla Dolly A Pre-anesthesia Checklist: Patient identified, Timeout performed, Emergency Drugs available, Suction available and Patient being monitored Patient Re-evaluated:Patient Re-evaluated prior to inductionOxygen Delivery Method: Circle system utilized Preoxygenation: Pre-oxygenation with 100% oxygen Intubation Type: Combination inhalational/ intravenous induction Ventilation: Mask ventilation without difficulty Laryngoscope Size: Mac and 5 Grade View: Grade I Tube type: Oral Tube size: 8.0 mm Number of attempts: 1 Airway Equipment and Method: Stylet Placement Confirmation: ETT inserted through vocal cords under direct vision,  positive ETCO2 and breath sounds checked- equal and bilateral Secured at: 23 cm Tube secured with: Tape Dental Injury: Teeth and Oropharynx as per pre-operative assessment

## 2014-05-15 ENCOUNTER — Encounter (HOSPITAL_COMMUNITY): Payer: Self-pay | Admitting: Specialist

## 2014-05-15 DIAGNOSIS — M75121 Complete rotator cuff tear or rupture of right shoulder, not specified as traumatic: Secondary | ICD-10-CM | POA: Diagnosis not present

## 2014-05-15 LAB — CBC
HCT: 38.9 % — ABNORMAL LOW (ref 39.0–52.0)
Hemoglobin: 13.6 g/dL (ref 13.0–17.0)
MCH: 31.1 pg (ref 26.0–34.0)
MCHC: 35 g/dL (ref 30.0–36.0)
MCV: 89 fL (ref 78.0–100.0)
Platelets: 194 10*3/uL (ref 150–400)
RBC: 4.37 MIL/uL (ref 4.22–5.81)
RDW: 13.2 % (ref 11.5–15.5)
WBC: 13.4 10*3/uL — ABNORMAL HIGH (ref 4.0–10.5)

## 2014-05-15 LAB — BASIC METABOLIC PANEL
ANION GAP: 9 (ref 5–15)
BUN: 19 mg/dL (ref 6–23)
CALCIUM: 8.5 mg/dL (ref 8.4–10.5)
CHLORIDE: 98 mmol/L (ref 96–112)
CO2: 29 mmol/L (ref 19–32)
Creatinine, Ser: 0.95 mg/dL (ref 0.50–1.35)
GFR calc Af Amer: >90 mL/min (ref >90)
GFR calc non Af Amer: 89 mL/min — ABNORMAL LOW (ref >90)
Glucose, Bld: 158 mg/dL — ABNORMAL HIGH (ref 70–99)
POTASSIUM: 3.2 mmol/L — AB (ref 3.5–5.1)
Sodium: 136 mmol/L (ref 135–145)

## 2014-05-15 MED ORDER — HYDROMORPHONE HCL 4 MG PO TABS: 4.0000 mg | ORAL_TABLET | ORAL | Status: DC | PRN

## 2014-05-15 NOTE — Progress Notes (Signed)
PT Cancellation Note  Patient Details Name: Noah Austin MRN: [id number] DOB: 1955/07/29   Cancelled Treatment:     PT eval order received.  Pt screened by OT.  Pt with no PT needs at this time.   Taler Kushner 05/15/2014, 12:52 PM

## 2014-05-15 NOTE — Progress Notes (Signed)
Subjective: 1 Day Post-Op Procedure(s) (LRB): RIGHT SHOULDER MINI OPEN ROTATOR CUFF REPAIR  (Right) Patient reports pain as moderate.  Reports pain at the shoulder and into the upper arm. Denies numbness or tingling. No N/V. No other c/o. Has not been seen by PT yet.  Objective: Vital signs in last 24 hours: Temp:  [97.8 F (36.6 C)-99.5 F (37.5 C)] 98.1 F (36.7 C) (01/29 0640) Pulse Rate:  [67-105] 92 (01/29 0640) Resp:  [10-20] 20 (01/29 0640) BP: (91-145)/(44-86) 122/72 mmHg (01/29 0640) SpO2:  [91 %-100 %] 92 % (01/29 0640)  Intake/Output from previous day: 01/28 0701 - 01/29 0700 In: 4948.8 [P.O.:1590; I.V.:3203.8; IV Piggyback:155] Out: 900 [Urine:900] Intake/Output this shift:     Recent Labs  05/12/14 1435 05/15/14 0535  HGB 15.6 13.6    Recent Labs  05/12/14 1435 05/15/14 0535  WBC 7.5 13.4*  RBC 5.07 4.37  HCT 45.1 38.9*  PLT 215 194    Recent Labs  05/12/14 1435 05/15/14 0535  NA 138 136  K 3.3* 3.2*  CL 101 98  CO2 27 29  BUN 24* 19  CREATININE 0.99 0.95  GLUCOSE 92 158*  CALCIUM 9.7 8.5   No results for input(s): LABPT, INR in the last 72 hours.  Neurologically intact ABD soft Neurovascular intact Sensation intact distally Intact pulses distally Dorsiflexion/Plantar flexion intact Incision: dressing C/D/I and no drainage No cellulitis present Compartment soft no sign of DVT  Sling in place  Assessment/Plan: 1 Day Post-Op Procedure(s) (LRB): RIGHT SHOULDER MINI OPEN ROTATOR CUFF REPAIR  (Right) Advance diet Up with therapy D/C IV fluids  PT post-op shoulder protocol, pendulums Plan D/C home later today after 1-2 PT sessions as long as pain remains well controlled Will discuss with Dr. Elissa Lovett, Dayna Barker. 05/15/2014, 7:52 AM

## 2014-05-15 NOTE — Op Note (Signed)
NAMEJEFFIE, SPIVACK              ACCOUNT NO.:  [id number]  MEDICAL RECORD NO.:  [id number]  LOCATION:                                 FACILITY:  PHYSICIAN:  Jene Every, M.D.    DATE OF BIRTH:  06-May-1955  DATE OF PROCEDURE:  05/14/2014 DATE OF DISCHARGE:                              OPERATIVE REPORT   PREOPERATIVE DIAGNOSES:  Rotator cuff tear impingement syndrome, massive tear, and biceps anchor tendon tear.  POSTOPERATIVE DIAGNOSES:  Rotator cuff tear impingement syndrome, massive tear, and biceps anchor tendon tear.  PROCEDURE PERFORMED: 1. Mini open rotator cuff repair of the supraspinatus and     infraspinatus with relocation of biceps tendon in the bicipital     groove. 2. Subacromial decompression and bursectomy.  ANESTHESIA:  General.  ASSISTANT:  Lanna Poche, PA.  HISTORY:  This is a 59 year old male, who injured his shoulder, rotator cuff tear by MRI indicated for repair of supraspinatus and infraspinatus retracted.  He also had biceps tendon that was subluxed  medially.  We discussed tenodesis versus repair.  Risks and benefits  discussed including bleeding, infection, damage to neurovascular structures, DVT, PE, anesthetic complications, etc.  TECHNIQUE:  With the patient in supine beach-chair position, after induction of adequate general anesthesia, and 3 g Kefzol in the right shoulder and upper extremity.  He was prepped and draped in usual sterile fashion.  He had full range of motion of the shoulder under anesthesia.  Surgical marker was utilized to delineate the acromion, AC joint, and coracoid.  A 2 cm anterolateral incision was made through the skin only between the anterior and middle heads of the deltoid. Subcutaneous tissue was dissected.  Electrocautery was utilized to achieve hemostasis.  The raphe between the anterior and medial heads were identified and divided in line with the skin incision, 2 cm. Released and excised the CA  ligament, spur off the anterior lateral aspect of the acromion was removed with 3 mm Kerrison.  Articular surface of the humeral head was identified.  Hypertrophic bursa was noted and excised.  We carefully mobilized the supraspinatus and infraspinatus tendon tear retracted which was noted near to the glenoid. It was mobilized with the bursal and articular surface.  The biceps was noted out of its groove.  We mobilized the tendon, repaired a trough lateral to the articular surface of the greater tuberosity.  We prepared with a Beyer rongeur to get bleeding tissue.  The biceps tendon came with the subscapularis supraspinatus tear.  We put 2 Push Lock, 2 Swivel Lock suture anchors in the middle of the cancellous bed, delivering it after marking it an awl, inserting the Push Lock, threading it with fiber tape through the supraspinatus and infraspinatus complex with a scorpion.  Two were used, we crossed those and advanced them anteriorly and laterally without undue tension and fixed them over the greater tuberosity with 2 Swivel Locks.  These were placed by localization with an awl and then inserting the crossed fiber tape suture ends through the Push Lock inserting them into the greater tuberosity.  We did this with the arm at the site without undue tension.  We had full coverage, we felt  biceps tendon in its groove.  Good flexion and extension of the arm was noted.  We copiously irrigated the wound, and the remainder of the cuff was unremarkable.  We had debrided the edges of the cuff prior to advancing it after good bleeding tissue.  I repaired the raphe with #1 Vicryl in a figure-of-eight sutures, subcu with 2-0, and skin with 4-0 subcuticular.  Prolene sterile dressing applied, placed an abduction pillow 0.25% Marcaine with epinephrine was infiltrated in the subacromial space and tissue.  He was extubated without difficulty and transported to the recovery in a satisfactory  condition.  The patient tolerated the procedure well.  No complications.  Minimal blood loss.     Jene Every, M.D.     Cordelia Pen  D:  05/14/2014  T:  05/15/2014  Job:  [id number]

## 2014-05-15 NOTE — Evaluation (Signed)
Occupational Therapy Evaluation Patient Details Name: Noah Austin MRN: [id number] DOB: 1955/09/10 Today's Date: 05/15/2014    History of Present Illness pt was admitted for R RCR:  he had full thickeness tear of supraspinatus and subscapularis.     Clinical Impression   Pt was admitted for the above.  All education was completed.  Pt will follow up with Dr Shelle Iron for further rehab.     Follow Up Recommendations    Pt will follow up with Dr Shelle Iron   Equipment Recommendations  None recommended by OT    Recommendations for Other Services       Precautions / Restrictions Precautions Precautions: Shoulder Type of Shoulder Precautions: sling at all times x pendulums/bathing/dressing Precaution Booklet Issued: Yes (comment) Restrictions Weight Bearing Restrictions: Yes Other Position/Activity Restrictions: NWB      Mobility Bed Mobility Overal bed mobility: Needs Assistance Bed Mobility: Supine to Sit           General bed mobility comments: HOB raised and use of rail:  pt plans to sleep in recliner  Transfers Overall transfer level: Independent                    Balance Overall balance assessment: No apparent balance deficits (not formally assessed)                                          ADL Overall ADL's : Needs assistance/impaired                         Toilet Transfer: Independent;Ambulation (to chair)             General ADL Comments: worked through bathing and reapplying sling: wife return demonstrated sling. She has arthritis but was able to secure sling.  Pt supported R wrist with LUE when she was assisting.  Patient needs overall max A for adls at this time:  he was limited by pain.  Performed pendulums to pt's ability:  he was not able to lean forward due to back pain so got very little passive movement.  See education section of chart.  Exchanged sling (XL) for large immobilizer.  Adapted waist strap to fit  around him.     Vision                     Perception     Praxis      Pertinent Vitals/Pain Pain Assessment: 0-10 Pain Score:  (6-10:  pain decreased when arm sling repositioned and pain m) Pain Location: R shoulder Pain Descriptors / Indicators: Aching Pain Intervention(s): Limited activity within patient's tolerance;Monitored during session;Premedicated before session;Repositioned;Ice applied     Hand Dominance Right   Extremity/Trunk Assessment Upper Extremity Assessment Upper Extremity Assessment: RUE deficits/detail RUE Deficits / Details: immobilized; distally wfls           Communication Communication Communication: No difficulties   Cognition Arousal/Alertness: Awake/alert Behavior During Therapy: WFL for tasks assessed/performed Overall Cognitive Status: Within Functional Limits for tasks assessed                     General Comments   Pt does not need PT in acute    Exercises       Shoulder Instructions      Home Living Family/patient expects to be discharged to:: Private residence Living  Arrangements: Spouse/significant other                 Bathroom Shower/Tub: Chief Strategy Officer: Standard     Home Equipment: None   Additional Comments: pt did not need to push up from commode: feels he will be OK      Prior Functioning/Environment Level of Independence: Independent             OT Diagnosis: Acute pain   OT Problem List:     OT Treatment/Interventions:      OT Goals(Current goals can be found in the care plan section)    OT Frequency:     Barriers to D/C:            Co-evaluation              End of Session    Activity Tolerance: Patient limited by pain Patient left: in chair;with call bell/phone within reach;with family/visitor present   Time: 4383-8184 OT Time Calculation (min): 49 min Charges:  OT General Charges $OT Visit: 1 Procedure OT Evaluation $Initial OT  Evaluation Tier I: 1 Procedure OT Treatments $Self Care/Home Management : 8-22 mins $Therapeutic Activity: 8-22 mins G-Codes:    Doloras Tellado 2014-05-30, 12:06 PM  Marica Otter, OTR/L [phone] May 30, 2014

## 2014-05-15 NOTE — Discharge Summary (Addendum)
Patient ID: Noah Austin MRN: [id number] DOB/AGE: 1955-06-10 59 y.o.  Admit date: 05/14/2014 Discharge date: 05/15/2014  Admission Diagnoses:  Principal Problem:   Tear of right rotator cuff Active Problems:   Complete rotator cuff tear   Discharge Diagnoses:  Same  Past Medical History  Diagnosis Date  . Hypertension   . Dyslipidemia   . Obesity (BMI 35.0-39.9 without comorbidity)   . Hyperlipidemia     Surgeries: Procedure(s): RIGHT SHOULDER MINI OPEN ROTATOR CUFF REPAIR  on 05/14/2014   Consultants:    Discharged Condition: Improved  Hospital Course: HAIGEN RIALS is an 59 y.o. male who was admitted 05/14/2014 for operative treatment ofTear of right rotator cuff. Patient has severe unremitting pain that affects sleep, daily activities, and work/hobbies. After pre-op clearance the patient was taken to the operating room on 05/14/2014 and underwent  Procedure(s): RIGHT SHOULDER MINI OPEN ROTATOR CUFF REPAIR .    Patient was given perioperative antibiotics: Anti-infectives    Start     Dose/Rate Route Frequency Ordered Stop   05/14/14 2000  ceFAZolin (ANCEF) IVPB 2 g/50 mL premix     2 g100 mL/hr over 30 Minutes Intravenous Every 6 hours 05/14/14 1957 05/15/14 0211   05/14/14 1400  ceFAZolin (ANCEF) 3 g in dextrose 5 % 50 mL IVPB  Status:  Discontinued     3 g160 mL/hr over 30 Minutes Intravenous Every 6 hours 05/14/14 1244 05/14/14 1956   05/14/14 0843  polymyxin B 500,000 Units, bacitracin 50,000 Units in sodium chloride irrigation 0.9 % 500 mL irrigation  Status:  Discontinued       As needed 05/14/14 0843 05/14/14 0907   05/14/14 0600  ceFAZolin (ANCEF) 3 g in dextrose 5 % 50 mL IVPB     3 g160 mL/hr over 30 Minutes Intravenous On call to O.R. 05/14/14 0529 05/14/14 0730       Patient was given sequential compression devices, early ambulation, and chemoprophylaxis to prevent DVT.  Patient benefited maximally from hospital stay and there were no complications.     Recent vital signs: Patient Vitals for the past 24 hrs:  BP Temp Temp src Pulse Resp SpO2  05/15/14 1015 138/80 mmHg 98.3 F (36.8 C) Oral 95 20 95 %  05/15/14 0640 122/72 mmHg 98.1 F (36.7 C) Oral 92 20 92 %  05/15/14 0135 121/73 mmHg 98.2 F (36.8 C) Oral 99 20 91 %  05/14/14 2055 130/65 mmHg 98.4 F (36.9 C) Oral 98 20 93 %  05/14/14 1800 (!) 144/80 mmHg 99.5 F (37.5 C) Oral (!) 105 20 94 %  05/14/14 1550 132/71 mmHg 98.5 F (36.9 C) Oral (!) 101 18 94 %  05/14/14 1450 (!) 145/86 mmHg 98.2 F (36.8 C) Oral (!) 102 20 96 %  05/14/14 1350 123/73 mmHg 97.9 F (36.6 C) Oral 93 15 95 %  05/14/14 1249 (!) 118/59 mmHg 98.3 F (36.8 C) - 90 16 93 %  05/14/14 1230 (!) 121/56 mmHg 98.2 F (36.8 C) - 94 15 95 %     Recent laboratory studies:  Recent Labs  05/12/14 1435 05/15/14 0535  WBC 7.5 13.4*  HGB 15.6 13.6  HCT 45.1 38.9*  PLT 215 194  NA 138 136  K 3.3* 3.2*  CL 101 98  CO2 27 29  BUN 24* 19  CREATININE 0.99 0.95  GLUCOSE 92 158*  CALCIUM 9.7 8.5     Discharge Medications:     Medication List    TAKE these medications  aspirin 81 MG tablet  Take 81 mg by mouth daily.     docusate sodium 100 MG capsule  Commonly known as:  COLACE  Take 1 capsule (100 mg total) by mouth 2 (two) times daily as needed for mild constipation.     lisinopril-hydrochlorothiazide 20-25 MG per tablet  Commonly known as:  PRINZIDE,ZESTORETIC  Take 1 tablet by mouth at bedtime.     methocarbamol 500 MG tablet  Commonly known as:  ROBAXIN  Take 1 tablet (500 mg total) by mouth 3 (three) times daily between meals as needed for muscle spasms.     OMEGA 3 PO  Take 2 capsules by mouth daily.     oxyCODONE-acetaminophen 5-325 MG per tablet  Commonly known as:  PERCOCET  Take 1-2 tablets by mouth every 4 (four) hours as needed.     pravastatin 40 MG tablet  Commonly known as:  PRAVACHOL  Take 40 mg by mouth at bedtime.     zolpidem 10 MG tablet  Commonly known as:   AMBIEN  Take 10 mg by mouth at bedtime.      Medication addendum: Dilaudid  tab Rx given instead of Oxycodone  Diagnostic Studies: Dg Chest 2 View  05/12/2014   CLINICAL DATA:  Preop right shoulder surgery.  Nonsmoker.  EXAM: CHEST  2 VIEW  COMPARISON:  03/13/2005.  FINDINGS: The heart size and mediastinal contours are within normal limits. Both lungs are clear. The visualized skeletal structures are unremarkable.  No significant interval change is evident.  IMPRESSION: No active cardiopulmonary disease.   Electronically Signed   By: Ellery Plunk M.D.   On: 05/12/2014 17:58    Disposition: 01-Home or Self Care      Discharge Instructions    Call MD / Call 911    Complete by:  As directed   If you experience chest pain or shortness of breath, CALL 911 and be transported to the hospital emergency room.  If you develope a fever above 101 F, pus (white drainage) or increased drainage or redness at the wound, or calf pain, call your surgeon's office.     Constipation Prevention    Complete by:  As directed   Drink plenty of fluids.  Prune juice may be helpful.  You may use a stool softener, such as Colace (over the counter) 100 mg twice a day.  Use MiraLax (over the counter) for constipation as needed.     Diet - low sodium heart healthy    Complete by:  As directed      Increase activity slowly as tolerated    Complete by:  As directed            Follow-up Information    Follow up with BEANE,JEFFREY C, MD In 2 weeks.   Specialty:  Orthopedic Surgery   Why:  For suture removal   Contact information:   [address] [place] [address] [phone]        Signed: Dorothy Spark. PA-C for Dr. Shelle Iron 05/15/2014, 12:23 PM

## 2014-05-19 NOTE — Progress Notes (Signed)
   05/15/14 1100  OT Time Calculation  OT Start Time (ACUTE ONLY) 1046  OT Stop Time (ACUTE ONLY) 1135  OT Time Calculation (min) 49 min  OT G-codes **NOT FOR INPATIENT CLASS**  Functional Assessment Tool Used clinical judgment and observation  Functional Limitation Self care  Self Care Current Status (N7972) CJ  Self Care Goal Status (Q2060) CJ  Self Care Discharge Status (R5615) CJ  OT General Charges  $OT Visit 1 Procedure  OT Evaluation  $Initial OT Evaluation Tier I 1 Procedure  OT Treatments  $Self Care/Home Management  8-22 mins  $Therapeutic Activity 8-22 mins  Marica Otter, OTR/L [phone] 05/19/2014

## 2014-12-18 ENCOUNTER — Other Ambulatory Visit: Payer: Self-pay | Admitting: Family Medicine

## 2014-12-18 DIAGNOSIS — R7989 Other specified abnormal findings of blood chemistry: Secondary | ICD-10-CM

## 2014-12-18 DIAGNOSIS — R945 Abnormal results of liver function studies: Principal | ICD-10-CM

## 2014-12-30 ENCOUNTER — Ambulatory Visit
Admission: RE | Admit: 2014-12-30 | Discharge: 2014-12-30 | Disposition: A | Discharge status: Home / Self Care | Payer: 59 | Source: Ambulatory Visit | Attending: Family Medicine | Admitting: Family Medicine

## 2014-12-30 DIAGNOSIS — R945 Abnormal results of liver function studies: Principal | ICD-10-CM

## 2014-12-30 DIAGNOSIS — R7989 Other specified abnormal findings of blood chemistry: Secondary | ICD-10-CM

## 2018-02-08 ENCOUNTER — Other Ambulatory Visit: Payer: Self-pay | Admitting: Family Medicine

## 2018-02-08 DIAGNOSIS — H538 Other visual disturbances: Secondary | ICD-10-CM

## 2018-02-19 ENCOUNTER — Ambulatory Visit
Admission: RE | Admit: 2018-02-19 | Discharge: 2018-02-19 | Disposition: A | Discharge status: Home / Self Care | Payer: Medicare Other | Source: Ambulatory Visit | Attending: Family Medicine | Admitting: Family Medicine

## 2018-02-19 DIAGNOSIS — H538 Other visual disturbances: Secondary | ICD-10-CM

## 2018-07-03 ENCOUNTER — Emergency Department: Payer: Medicare Other

## 2018-07-03 ENCOUNTER — Other Ambulatory Visit: Payer: Self-pay

## 2018-07-03 ENCOUNTER — Emergency Department
Admission: EM | Admit: 2018-07-03 | Discharge: 2018-07-03 | Disposition: A | Discharge status: Home / Self Care | Payer: Medicare Other | Attending: Emergency Medicine | Admitting: Emergency Medicine

## 2018-07-03 DIAGNOSIS — Z7982 Long term (current) use of aspirin: Secondary | ICD-10-CM | POA: Insufficient documentation

## 2018-07-03 DIAGNOSIS — I10 Essential (primary) hypertension: Secondary | ICD-10-CM | POA: Diagnosis not present

## 2018-07-03 DIAGNOSIS — Z79899 Other long term (current) drug therapy: Secondary | ICD-10-CM | POA: Diagnosis not present

## 2018-07-03 DIAGNOSIS — Z87891 Personal history of nicotine dependence: Secondary | ICD-10-CM | POA: Insufficient documentation

## 2018-07-03 DIAGNOSIS — R202 Paresthesia of skin: Secondary | ICD-10-CM | POA: Diagnosis not present

## 2018-07-03 LAB — COMPREHENSIVE METABOLIC PANEL
ALK PHOS: 58 U/L (ref 38–126)
ALT: 42 U/L (ref 0–44)
ANION GAP: 11 (ref 5–15)
AST: 34 U/L (ref 15–41)
Albumin: 4.5 g/dL (ref 3.5–5.0)
BUN: 24 mg/dL — ABNORMAL HIGH (ref 8–23)
CHLORIDE: 103 mmol/L (ref 98–111)
CO2: 24 mmol/L (ref 22–32)
Calcium: 8.9 mg/dL (ref 8.9–10.3)
Creatinine, Ser: 0.83 mg/dL (ref 0.61–1.24)
GFR calc Af Amer: >60 mL/min (ref >60)
GFR calc non Af Amer: >60 mL/min (ref >60)
GLUCOSE: 118 mg/dL — AB (ref 70–99)
Potassium: 3.3 mmol/L — ABNORMAL LOW (ref 3.5–5.1)
SODIUM: 138 mmol/L (ref 135–145)
Total Bilirubin: 1.1 mg/dL (ref 0.3–1.2)
Total Protein: 7.1 g/dL (ref 6.5–8.1)

## 2018-07-03 LAB — CBC
HCT: 43.5 % (ref 39.0–52.0)
HEMOGLOBIN: 15.4 g/dL (ref 13.0–17.0)
MCH: 31.4 pg (ref 26.0–34.0)
MCHC: 35.4 g/dL (ref 30.0–36.0)
MCV: 88.6 fL (ref 80.0–100.0)
NRBC: 0 % (ref 0.0–0.2)
Platelets: 222 10*3/uL (ref 150–400)
RBC: 4.91 MIL/uL (ref 4.22–5.81)
RDW: 13.4 % (ref 11.5–15.5)
WBC: 7.1 10*3/uL (ref 4.0–10.5)

## 2018-07-03 LAB — TROPONIN I: Troponin I: <0.03 ng/mL (ref <0.03)

## 2018-07-03 MED ORDER — POTASSIUM CHLORIDE CRYS ER 20 MEQ PO TBCR
20.0000 meq | EXTENDED_RELEASE_TABLET | Freq: Once | ORAL | Status: AC
  Administered 2018-07-03: 20 meq via ORAL
  Filled 2018-07-03: qty 1

## 2018-07-03 MED ORDER — GABAPENTIN 300 MG PO CAPS
300.0000 mg | ORAL_CAPSULE | Freq: Once | ORAL | Status: AC
  Administered 2018-07-03: 300 mg via ORAL
  Filled 2018-07-03: qty 1

## 2018-07-03 NOTE — ED Provider Notes (Signed)
[facility] Emergency Department Provider Note  ____________________________________________   I have reviewed the triage vital signs and the nursing notes.   HISTORY  Chief Complaint Tingling  History limited by: Not Limited   HPI Noah Austin is a 63 y.o. male who presents to the emergency department today with primary concern for tingling. Patient describes a sensation of tingling in his extremities. It has been present for the past 3 days. He states that the symptoms come and go. He notices it more when he is sitting or lying down and less when he is standing or walking. It does kind of remind him of when his arms go to sleep. He states that he has had a somewhat similar kind of symptom in the past but never as severe. The patient states that tonight he also had a sensation of very bright lights and blurriness in his eyes. It occurred in both eyes and only lasted a couple of minutes. Had similar symptoms once before and states he underwent MRI at that time which was negative.  Additionally he states that he had a he also had some central chest discomfort as well.  This was also similar to the tingling in been having in his extremities.  Did have my exam he still complaining of some tingling in his extremities.    Records reviewed. Per medical record review patient has a history of hyperlipidemia, hypertension.  Past Medical History:  Diagnosis Date  . Dyslipidemia   . Hyperlipidemia   . Hypertension   . Obesity (BMI 35.0-39.9 without comorbidity)     Patient Active Problem List   Diagnosis Date Noted  . Tear of right rotator cuff 05/14/2014  . Complete rotator cuff tear 05/14/2014  . Obesity (BMI 30-39.9) 07/17/2011  . Umbilical hernia with obstruction 07/17/2011  . Hypertension 07/17/2011  . Hyperlipidemia 07/17/2011    Past Surgical History:  Procedure Laterality Date  . HERNIA REPAIR  07/17/11   umbilical hernia  . left inguinal hernia      Repair about 27 years ago, about 25 yrs for 2nd surgery  . SHOULDER OPEN ROTATOR CUFF REPAIR Right 05/14/2014   Procedure: RIGHT SHOULDER MINI OPEN ROTATOR CUFF REPAIR ;  Surgeon: Javier Docker, MD;  Location: [facility] ORS;  Service: Orthopedics;  Laterality: Right;  . UMBILICAL HERNIA REPAIR  07/17/2011   Procedure: HERNIA REPAIR UMBILICAL ADULT;  Surgeon: Ernestene Mention, MD;  Location: [facility] ORS;  Service: General;  Laterality: N/A;  repair strangulated umbilical hernia    Prior to Admission medications   Medication Sig Start Date End Date Taking? Authorizing Provider  aspirin 81 MG tablet Take 81 mg by mouth daily.    [provider]  docusate sodium (COLACE) 100 MG capsule Take 1 capsule (100 mg total) by mouth 2 (two) times daily as needed for mild constipation. 05/14/14   Jene Every, MD  HYDROmorphone (DILAUDID) 4 MG tablet Take 1 tablet (4 mg total) by mouth every 4 (four) hours as needed for severe pain. 05/15/14   Jene Every, MD  lisinopril-hydrochlorothiazide (PRINZIDE,ZESTORETIC) 20-25 MG per tablet Take 1 tablet by mouth at bedtime.     [provider]  methocarbamol (ROBAXIN) 500 MG tablet Take 1 tablet (500 mg total) by mouth 3 (three) times daily between meals as needed for muscle spasms. 05/14/14   Jene Every, MD  Omega-3 Fatty Acids (OMEGA 3 PO) Take 2 capsules by mouth daily.    [provider]  pravastatin (PRAVACHOL) 40  MG tablet Take 40 mg by mouth at bedtime.     [provider]  zolpidem (AMBIEN) 10 MG tablet Take 10 mg by mouth at bedtime.    [provider]    Allergies Codeine  Family History  Problem Relation Age of Onset  . Cancer Maternal Grandmother        colon    Social History Social History   Tobacco Use  . Smoking status: Former Smoker    Years: 10.00    Last attempt to quit: 07/16/1988    Years since quitting: 29.9  . Smokeless tobacco: Never Used  Substance Use Topics  . Alcohol use: No  . Drug  use: No    Review of Systems Constitutional: No fever/chills Eyes: Positive for vision change ENT: No sore throat. Cardiovascular: Positive for chest discomfort Respiratory: Denies shortness of breath. Gastrointestinal: No abdominal pain.  No nausea, no vomiting.  No diarrhea.   Genitourinary: Negative for dysuria. Musculoskeletal: Positive for extremity tingling Skin: Negative for rash. Neurological: Negative for headaches, focal weakness or numbness.  ____________________________________________   PHYSICAL EXAM:  VITAL SIGNS: ED Triage Vitals  Enc Vitals Group     BP 07/03/18 2031 (!) 152/79     Pulse Rate 07/03/18 2031 93     Resp 07/03/18 2031 16     Temp 07/03/18 2031 98.6 F (37 C)     Temp Source 07/03/18 2031 Oral     SpO2 07/03/18 2031 96 %     Weight 07/03/18 2032 299 lb (135.6 kg)     Height 07/03/18 2032 6\' 1"  (1.854 m)     Head Circumference --      Peak Flow --      Pain Score 07/03/18 2031 5   Constitutional: Alert and oriented.  Eyes: Conjunctivae are normal.  ENT      Head: Normocephalic and atraumatic.      Nose: No congestion/rhinnorhea.      Mouth/Throat: Mucous membranes are moist.      Neck: No stridor. Hematological/Lymphatic/Immunilogical: No cervical lymphadenopathy. Cardiovascular: Normal rate, regular rhythm.  No murmurs, rubs, or gallops.  Respiratory: Normal respiratory effort without tachypnea nor retractions. Breath sounds are clear and equal bilaterally. No wheezes/rales/rhonchi. Gastrointestinal: Soft and non tender. No rebound. No guarding.  Genitourinary: Deferred Musculoskeletal: Normal range of motion in all extremities. No lower extremity edema. Neurologic:  Normal speech and language. No gross focal neurologic deficits are appreciated.  Skin:  Skin is warm, dry and intact. No rash noted. Psychiatric: Mood and affect are normal. Speech and behavior are normal. Patient exhibits appropriate insight and  judgment.  ____________________________________________    LABS (pertinent positives/negatives)  Trop <0.03 CBC wbc 7.1, hgb 15.4, plt 222 CMP wnl except k 3.3, glu 118, bun 24  ____________________________________________   EKG  I, Phineas Semen, attending physician, personally viewed and interpreted this EKG  EKG Time: 2029 Rate: 89 Rhythm: sinus rhythm with pvc Axis: left axis deviation Intervals: qtc 508 QRS: RBBB ST changes: no st elevation Impression: abnormal ekg  ____________________________________________    RADIOLOGY  CXR No active disease  ____________________________________________   PROCEDURES  Procedures  ____________________________________________   INITIAL IMPRESSION / ASSESSMENT AND PLAN / ED COURSE  Pertinent labs & imaging results that were available during my care of the patient were reviewed by me and considered in my medical decision making (see chart for details).   Patient presented to the emergency department with primary complaint of tingling to his extremities.  Differential  would include neuropathy, electrolyte abnormality, nerve disorder.  Patient's potassium is a little bit low.  Did feel better after gabapentin and potassium.  He also had some claims of brief vision change as well as some chest tingling and discomfort.  EKG and troponin both negative.  At this point I doubt ACS given clinical history.  Doubt intracranial process such as bleed or mass.  His vision change was bilateral and was very brief.  Unclear etiology.  At this point however given negative work-up and that patient feels improved feel it is reasonable for patient to continue work-up as an outpatient.  Discussed findings and plan with patient.  ____________________________________________   FINAL CLINICAL IMPRESSION(S) / ED DIAGNOSES  Final diagnoses:  Paresthesias     Note: This dictation was prepared with Dragon dictation. Any transcriptional errors  that result from this process are unintentional     Phineas Semen, MD 07/03/18 2328

## 2018-07-03 NOTE — ED Triage Notes (Signed)
Pt states began to have chest pain central chest this pm. Pt states for 3-4 days "I have been tingling all over my body at times." pt states tonight for 3 minutes he had "bright lights" sensation to his eyes. Pt denies nausea, shob.

## 2018-07-03 NOTE — Discharge Instructions (Addendum)
Please seek medical attention for any high fevers, chest pain, shortness of breath, change in behavior, persistent vomiting, bloody stool or any other new or concerning symptoms.  

## 2019-01-13 ENCOUNTER — Other Ambulatory Visit: Payer: Self-pay | Admitting: *Deleted

## 2019-01-13 DIAGNOSIS — Z20822 Contact with and (suspected) exposure to covid-19: Secondary | ICD-10-CM

## 2019-01-14 ENCOUNTER — Telehealth: Payer: Self-pay | Admitting: *Deleted

## 2019-01-14 LAB — NOVEL CORONAVIRUS, NAA: SARS-CoV-2, NAA: DETECTED — AB

## 2019-01-14 NOTE — Telephone Encounter (Signed)
Pt called to check on his covid-19 test results. Not back yet.

## 2019-01-22 ENCOUNTER — Ambulatory Visit: Payer: Self-pay | Admitting: *Deleted

## 2019-01-22 NOTE — Telephone Encounter (Signed)
I returned his call.    He needs to see his dentist regarding an abscessed tooth.   He tested positive for the COVID-19 on 01/13/2019.   The dentist is requiring he be retested to see if he gets a negative report before the dentist can work on his tooth. I let him know he could be retested.   "I was told I could be retested on Monday 01/27/2019".    His 10 day quarantine ends today and he does not have any symptoms.   "I want to wait and be tested on Monday 10/12".    I let him know that would be fine. I also sent him a link to his e mail to set up his MyChart account.  He thanked me for my help.  Reason for Disposition . General information question, no triage required and triager able to answer question  Answer Assessment - Initial Assessment Questions 1. REASON FOR CALL or QUESTION: "What is your reason for calling today?" or "How can I best help you?" or "What question do you have that I can help answer?"     I have a abscessed tooth that I need to see the dentist about.   Can I get checked again for the COVID-19 since I was positive on 01/13/2019.  Protocols used: INFORMATION ONLY CALL - NO TRIAGE-A-AH

## 2020-05-11 DIAGNOSIS — Z20822 Contact with and (suspected) exposure to covid-19: Secondary | ICD-10-CM | POA: Diagnosis not present

## 2020-05-11 DIAGNOSIS — Z03818 Encounter for observation for suspected exposure to other biological agents ruled out: Secondary | ICD-10-CM | POA: Diagnosis not present

## 2020-05-12 DIAGNOSIS — I10 Essential (primary) hypertension: Secondary | ICD-10-CM | POA: Diagnosis not present

## 2020-05-12 DIAGNOSIS — G479 Sleep disorder, unspecified: Secondary | ICD-10-CM | POA: Diagnosis not present

## 2020-05-12 DIAGNOSIS — G4733 Obstructive sleep apnea (adult) (pediatric): Secondary | ICD-10-CM | POA: Diagnosis not present

## 2020-05-13 DIAGNOSIS — H40012 Open angle with borderline findings, low risk, left eye: Secondary | ICD-10-CM | POA: Diagnosis not present

## 2020-05-17 DIAGNOSIS — R609 Edema, unspecified: Secondary | ICD-10-CM | POA: Diagnosis not present

## 2020-05-17 DIAGNOSIS — Z23 Encounter for immunization: Secondary | ICD-10-CM | POA: Diagnosis not present

## 2020-05-17 DIAGNOSIS — R202 Paresthesia of skin: Secondary | ICD-10-CM | POA: Diagnosis not present

## 2020-05-17 DIAGNOSIS — I10 Essential (primary) hypertension: Secondary | ICD-10-CM | POA: Diagnosis not present

## 2020-05-17 DIAGNOSIS — E7800 Pure hypercholesterolemia, unspecified: Secondary | ICD-10-CM | POA: Diagnosis not present

## 2020-05-17 DIAGNOSIS — G4700 Insomnia, unspecified: Secondary | ICD-10-CM | POA: Diagnosis not present

## 2020-05-17 DIAGNOSIS — M170 Bilateral primary osteoarthritis of knee: Secondary | ICD-10-CM | POA: Diagnosis not present

## 2020-05-17 DIAGNOSIS — E1169 Type 2 diabetes mellitus with other specified complication: Secondary | ICD-10-CM | POA: Diagnosis not present

## 2020-06-03 ENCOUNTER — Emergency Department
Admission: EM | Admit: 2020-06-03 | Discharge: 2020-06-03 | Disposition: A | Discharge status: Home / Self Care | Payer: Medicare Other | Attending: Emergency Medicine | Admitting: Emergency Medicine

## 2020-06-03 ENCOUNTER — Other Ambulatory Visit: Payer: Self-pay

## 2020-06-03 ENCOUNTER — Emergency Department: Payer: Medicare Other

## 2020-06-03 DIAGNOSIS — S29011A Strain of muscle and tendon of front wall of thorax, initial encounter: Secondary | ICD-10-CM

## 2020-06-03 DIAGNOSIS — K8020 Calculus of gallbladder without cholecystitis without obstruction: Secondary | ICD-10-CM | POA: Insufficient documentation

## 2020-06-03 DIAGNOSIS — R079 Chest pain, unspecified: Secondary | ICD-10-CM | POA: Diagnosis not present

## 2020-06-03 DIAGNOSIS — K8000 Calculus of gallbladder with acute cholecystitis without obstruction: Secondary | ICD-10-CM | POA: Diagnosis not present

## 2020-06-03 DIAGNOSIS — Z79899 Other long term (current) drug therapy: Secondary | ICD-10-CM | POA: Insufficient documentation

## 2020-06-03 DIAGNOSIS — R1011 Right upper quadrant pain: Secondary | ICD-10-CM | POA: Diagnosis not present

## 2020-06-03 DIAGNOSIS — S299XXA Unspecified injury of thorax, initial encounter: Secondary | ICD-10-CM | POA: Diagnosis present

## 2020-06-03 DIAGNOSIS — X501XXA Overexertion from prolonged static or awkward postures, initial encounter: Secondary | ICD-10-CM | POA: Diagnosis not present

## 2020-06-03 DIAGNOSIS — I10 Essential (primary) hypertension: Secondary | ICD-10-CM | POA: Diagnosis not present

## 2020-06-03 DIAGNOSIS — Z87891 Personal history of nicotine dependence: Secondary | ICD-10-CM | POA: Diagnosis not present

## 2020-06-03 DIAGNOSIS — R0781 Pleurodynia: Secondary | ICD-10-CM | POA: Diagnosis not present

## 2020-06-03 DIAGNOSIS — Z7982 Long term (current) use of aspirin: Secondary | ICD-10-CM | POA: Diagnosis not present

## 2020-06-03 DIAGNOSIS — S29019A Strain of muscle and tendon of unspecified wall of thorax, initial encounter: Secondary | ICD-10-CM | POA: Diagnosis not present

## 2020-06-03 LAB — BASIC METABOLIC PANEL
Anion gap: 10 (ref 5–15)
BUN: 22 mg/dL (ref 8–23)
CO2: 26 mmol/L (ref 22–32)
Calcium: 9 mg/dL (ref 8.9–10.3)
Chloride: 104 mmol/L (ref 98–111)
Creatinine, Ser: 0.94 mg/dL (ref 0.61–1.24)
GFR, Estimated: >60 mL/min (ref >60)
Glucose, Bld: 124 mg/dL — ABNORMAL HIGH (ref 70–99)
Potassium: 3.5 mmol/L (ref 3.5–5.1)
Sodium: 140 mmol/L (ref 135–145)

## 2020-06-03 LAB — CBC
HCT: 44.3 % (ref 39.0–52.0)
Hemoglobin: 15.2 g/dL (ref 13.0–17.0)
MCH: 30.4 pg (ref 26.0–34.0)
MCHC: 34.3 g/dL (ref 30.0–36.0)
MCV: 88.6 fL (ref 80.0–100.0)
Platelets: 205 10*3/uL (ref 150–400)
RBC: 5 MIL/uL (ref 4.22–5.81)
RDW: 13.2 % (ref 11.5–15.5)
WBC: 5.9 10*3/uL (ref 4.0–10.5)
nRBC: 0 % (ref 0.0–0.2)

## 2020-06-03 LAB — HEPATIC FUNCTION PANEL
ALT: 29 U/L (ref 0–44)
AST: 27 U/L (ref 15–41)
Albumin: 3.6 g/dL (ref 3.5–5.0)
Alkaline Phosphatase: 50 U/L (ref 38–126)
Bilirubin, Direct: 0.2 mg/dL (ref 0.0–0.2)
Indirect Bilirubin: 0.8 mg/dL (ref 0.3–0.9)
Total Bilirubin: 1 mg/dL (ref 0.3–1.2)
Total Protein: 6.3 g/dL — ABNORMAL LOW (ref 6.5–8.1)

## 2020-06-03 LAB — TROPONIN I (HIGH SENSITIVITY)
Troponin I (High Sensitivity): 16 ng/L (ref <18)
Troponin I (High Sensitivity): 17 ng/L (ref <18)

## 2020-06-03 LAB — LIPASE, BLOOD: Lipase: 27 U/L (ref 11–51)

## 2020-06-03 MED ORDER — FENTANYL CITRATE (PF) 100 MCG/2ML IJ SOLN
50.0000 ug | Freq: Once | INTRAMUSCULAR | Status: AC
  Administered 2020-06-03: 50 ug via INTRAVENOUS
  Filled 2020-06-03: qty 2

## 2020-06-03 MED ORDER — OXYCODONE HCL 5 MG PO TABS
5.0000 mg | ORAL_TABLET | Freq: Once | ORAL | Status: AC
  Administered 2020-06-03: 5 mg via ORAL
  Filled 2020-06-03: qty 1

## 2020-06-03 MED ORDER — KETOROLAC TROMETHAMINE 30 MG/ML IJ SOLN
15.0000 mg | Freq: Once | INTRAMUSCULAR | Status: AC
  Administered 2020-06-03: 15 mg via INTRAVENOUS
  Filled 2020-06-03: qty 1

## 2020-06-03 MED ORDER — ACETAMINOPHEN 500 MG PO TABS: 1000.0000 mg | ORAL_TABLET | Freq: Three times a day (TID) | ORAL | 0 refills | Status: AC | PRN

## 2020-06-03 MED ORDER — METHOCARBAMOL 500 MG PO TABS: 500.0000 mg | ORAL_TABLET | Freq: Four times a day (QID) | ORAL | 0 refills | Status: DC | PRN

## 2020-06-03 MED ORDER — ONDANSETRON HCL 4 MG/2ML IJ SOLN
4.0000 mg | Freq: Once | INTRAMUSCULAR | Status: AC
  Administered 2020-06-03: 4 mg via INTRAVENOUS
  Filled 2020-06-03: qty 2

## 2020-06-03 MED ORDER — IOHEXOL 350 MG/ML SOLN
100.0000 mL | Freq: Once | INTRAVENOUS | Status: AC | PRN
  Administered 2020-06-03: 100 mL via INTRAVENOUS

## 2020-06-03 MED ORDER — ACETAMINOPHEN 500 MG PO TABS
1000.0000 mg | ORAL_TABLET | Freq: Once | ORAL | Status: AC
  Administered 2020-06-03: 1000 mg via ORAL
  Filled 2020-06-03: qty 2

## 2020-06-03 MED ORDER — TRAMADOL HCL 50 MG PO TABS: 50.0000 mg | ORAL_TABLET | Freq: Four times a day (QID) | ORAL | 0 refills | Status: AC | PRN

## 2020-06-03 NOTE — ED Notes (Signed)
Pt to CT dept via stretcher at this time; awake and alert- no acute change noted.

## 2020-06-03 NOTE — ED Notes (Signed)
Bedside ultrasound in progress at this time - pt tolerating without complaint

## 2020-06-03 NOTE — ED Notes (Signed)
Pt arrives from triage/waiting room via w/c -- able to transfer from w/c to stretcher with moderate degree of difficulty d/t RUQ/R lower rib cage region pain.  Pt confirms hearing and feeling what he describes as "pop" approx 1700hrs day prior to arrival while bending to pick something up.  RR even and unlabored on RA with symmetrical rise and fall of chest - lung sounds CTA.  Pt placed on continuous cardiac monitor- NSR HR 60s - S1 and S2 regular.  Abdomen obese and soft however RUQ is enlarged in comparison to other areas of abdomen -- RUQ remains soft and is nontender with palpation.  Hypoactive BS x4 - pt reports last BM prior to arrival.  Skin warm dry and intact.  20G SL to L AC; dressing dry and intact.  Pt updated on plan for pending CT abd/pelvis and CT chest -- requests that wife be notified that she can come in - this nurse has attempted to call spouse (who is in car) however call went to voice mail (message left)- pt denies any additional questions concerns needs at this time.  Call bell in reach.

## 2020-06-03 NOTE — ED Notes (Signed)
Pt agreeable to d/c plan as discussed by Dr Don Perking- this nurse has verbally reinforced d/c instructions and provided pt with written copy- pt acknowledges verbal understanding and denies any additional questions, concerns, needs -- escorted to exit via w/c-- wife is transporting pt home

## 2020-06-03 NOTE — ED Provider Notes (Signed)
[facility] Emergency Department Provider Note  ____________________________________________  Time seen: Approximately 2:23 AM  I have reviewed the triage vital signs and the nursing notes.   HISTORY  Chief Complaint Chest Pain   HPI Noah Austin is a 65 y.o. male with history of hypertension, hyperlipidemia, obesity, umbilical hernia status post repair in 2013 who presents for evaluation of right upper quadrant abdominal/ right lower chest pain.  Patient reports being in his usual state of health when he reached down to grab something this evening off the floor and felt something pop in his right upper quadrant.  Has had pain constantly which is moderate in intensity and sharp since it happened.  He denies central chest pain or shortness of breath.  The pain is worse with inspiration or movement.  No nausea or vomiting.  Patient does report feeling that his right upper abdominal area looks swollen.  No cough or fever.   Past Medical History:  Diagnosis Date  . Dyslipidemia   . Hyperlipidemia   . Hypertension   . Obesity (BMI 35.0-39.9 without comorbidity)     Patient Active Problem List   Diagnosis Date Noted  . Tear of right rotator cuff 05/14/2014  . Complete rotator cuff tear 05/14/2014  . Obesity (BMI 30-39.9) 07/17/2011  . Umbilical hernia with obstruction 07/17/2011  . Hypertension 07/17/2011  . Hyperlipidemia 07/17/2011    Past Surgical History:  Procedure Laterality Date  . HERNIA REPAIR  07/17/11   umbilical hernia  . left inguinal hernia     Repair about 27 years ago, about 25 yrs for 2nd surgery  . SHOULDER OPEN ROTATOR CUFF REPAIR Right 05/14/2014   Procedure: RIGHT SHOULDER MINI OPEN ROTATOR CUFF REPAIR ;  Surgeon: Javier Docker, MD;  Location: [facility] ORS;  Service: Orthopedics;  Laterality: Right;  . UMBILICAL HERNIA REPAIR  07/17/2011   Procedure: HERNIA REPAIR UMBILICAL ADULT;  Surgeon: Ernestene Mention, MD;  Location: [facility] OR[facility];   Service: General;  Laterality: N/A;  repair strangulated umbilical hernia    Prior to Admission medications   Medication Sig Start Date End Date Taking? Authorizing Provider  acetaminophen (TYLENOL) 500 MG tablet Take 2 tablets (1,000 mg total) by mouth every 8 (eight) hours as needed for mild pain, moderate pain, fever or headache. 06/03/20 06/03/21 Yes Don Perking, [place], [place]  methocarbamol (ROBAXIN) 500 MG tablet Take 1 tablet (500 mg total) by mouth every 6 (six) hours as needed for muscle spasms. 06/03/20  Yes Don Perking, [place], [place]  traMADol (ULTRAM) 50 MG tablet Take 1 tablet (50 mg total) by mouth every 6 (six) hours as needed. 06/03/20 06/03/21 Yes Curtis Cain, [place], MD  aspirin 81 MG tablet Take 81 mg by mouth daily.    [provider]  docusate sodium (COLACE) 100 MG capsule Take 1 capsule (100 mg total) by mouth 2 (two) times daily as needed for mild constipation. 05/14/14   Jene Every, MD  lisinopril-hydrochlorothiazide (PRINZIDE,ZESTORETIC) 20-25 MG per tablet Take 1 tablet by mouth at bedtime.     [provider]  Omega-3 Fatty Acids (OMEGA 3 PO) Take 2 capsules by mouth daily.    [provider]  pravastatin (PRAVACHOL) 40 MG tablet Take 40 mg by mouth at bedtime.     [provider]  zolpidem (AMBIEN) 10 MG tablet Take 10 mg by mouth at bedtime.    [provider]    Allergies Codeine  Family History  Problem Relation Age of Onset  . Cancer  Maternal Grandmother        colon    Social History Social History   Tobacco Use  . Smoking status: Former Smoker    Years: 10.00    Quit date: 07/16/1988    Years since quitting: 31.9  . Smokeless tobacco: Never Used  Substance Use Topics  . Alcohol use: No  . Drug use: No    Review of Systems  Constitutional: Negative for fever. Eyes: Negative for visual changes. ENT: Negative for sore throat. Neck: No neck pain  Cardiovascular: Negative for chest pain. Respiratory: Negative  for shortness of breath. Gastrointestinal: + RUQ abdominal pain. No vomiting or diarrhea. Genitourinary: Negative for dysuria. Musculoskeletal: Negative for back pain. Skin: Negative for rash. Neurological: Negative for headaches, weakness or numbness. Psych: No SI or HI  ____________________________________________   PHYSICAL EXAM:  VITAL SIGNS: ED Triage Vitals  Enc Vitals Group     BP 06/03/20 0115 (!) 172/108     Pulse Rate 06/03/20 0115 76     Resp 06/03/20 0115 20     Temp 06/03/20 0115 97.6 F (36.4 C)     Temp Source 06/03/20 0115 Oral     SpO2 06/03/20 0115 93 %     Weight 06/03/20 0111 300 lb (136.1 kg)     Height 06/03/20 0111 6\' 1"  (1.854 m)     Head Circumference --      Peak Flow --      Pain Score 06/03/20 0111 10     Pain Loc --      Pain Edu? --      Excl. in GC? --     Constitutional: Alert and oriented. Well appearing and in no apparent distress. HEENT:      Head: Normocephalic and atraumatic.         Eyes: Conjunctivae are normal. Sclera is non-icteric.       Mouth/Throat: Mucous membranes are moist.       Neck: Supple with no signs of meningismus. Cardiovascular: Regular rate and rhythm. No murmurs, gallops, or rubs. 2+ symmetrical distal pulses are present in all extremities. No JVD. Respiratory: Normal respiratory effort. Lungs are clear to auscultation bilaterally.  Gastrointestinal: Obese, no obvious hernia palpable, non tender, and non distended with positive bowel sounds. No rebound or guarding. Musculoskeletal:  No edema, cyanosis, or erythema of extremities. Neurologic: Normal speech and language. Face is symmetric. Moving all extremities. No gross focal neurologic deficits are appreciated. Skin: Skin is warm, dry and intact. No rash noted. Psychiatric: Mood and affect are normal. Speech and behavior are normal.  ____________________________________________   LABS (all labs ordered are listed, but only abnormal results are  displayed)  Labs Reviewed  BASIC METABOLIC PANEL - Abnormal; Notable for the following components:      Result Value   Glucose, Bld 124 (*)    All other components within normal limits  HEPATIC FUNCTION PANEL - Abnormal; Notable for the following components:   Total Protein 6.3 (*)    All other components within normal limits  CBC  LIPASE, BLOOD  TROPONIN I (HIGH SENSITIVITY)  TROPONIN I (HIGH SENSITIVITY)   ____________________________________________  EKG  ED ECG REPORT I, 06/05/20, the attending physician, personally viewed and interpreted this ECG.  Sinus rhythm, rate of 83, right bundle branch block, borderline prolonged QTC, no ST elevations or depressions.  Unchanged when compared to prior from 2020 ____________________________________________  RADIOLOGY  I have personally reviewed the images performed during this visit and I agree  with the Radiologist's read.   Interpretation by Radiologist:  DG Chest 2 View  Result Date: 06/03/2020 CLINICAL DATA:  Chest pain EXAM: CHEST - 2 VIEW COMPARISON:  07/03/2018 FINDINGS: The heart size and mediastinal contours are within normal limits. Both lungs are clear. The visualized skeletal structures are unremarkable. IMPRESSION: No active cardiopulmonary disease. Electronically Signed   By: Deatra RobinsonKevin  Herman M.D.   On: 06/03/2020 01:51   CT Angio Chest PE W and/or Wo Contrast  Result Date: 06/03/2020 CLINICAL DATA:  reached down to grab something off of floor and felt something pop and now has pain in right lower rib cage. pain worse with movement and inspiration EXAM: CT ANGIOGRAPHY CHEST CT ABDOMEN AND PELVIS WITH CONTRAST TECHNIQUE: Multidetector CT imaging of the chest was performed using the standard protocol during bolus administration of intravenous contrast. Multiplanar CT image reconstructions and MIPs were obtained to evaluate the vascular anatomy. Multidetector CT imaging of the abdomen and pelvis was performed using the  standard protocol during bolus administration of intravenous contrast. CONTRAST:  100mL OMNIPAQUE IOHEXOL 350 MG/ML SOLN COMPARISON:  Ultrasound abdomen 12/30/2014 FINDINGS: CTA CHEST FINDINGS Cardiovascular: Satisfactory opacification of the pulmonary arteries to the segmental level. No evidence of pulmonary embolism. Normal heart size. No significant pericardial effusion. The thoracic aorta is normal in caliber. No atherosclerotic plaque of the thoracic aorta. No coronary artery calcifications. Mediastinum/Nodes: No enlarged mediastinal, hilar, or axillary lymph nodes. Thyroid gland, trachea, and esophagus demonstrate no significant findings. Lungs/Pleura: Right lower lobe linear atelectasis. Left base linear atelectasis. Suggestion of a pulmonary micronodule within the left lower lobe (7:70, 8:82). No pulmonary mass. No pleural effusion. No pneumothorax. Musculoskeletal: Slightly heterogeneous, isodense to muscle, well-circumscribed, 5.2 x 3.9 cm superificial subcutaneus soft tissue lesion anterior to the manubrium (5:22). Fluid density lesion within the superficial subcutaneous soft tissues of the upper left/midline back measuring 3.4 x 2.1 cm (5:28). No suspicious lytic or blastic osseous lesions. No acute displaced fracture. Multilevel degenerative changes of the spine. Review of the MIP images confirms the above findings. CT ABDOMEN and PELVIS FINDINGS Hepatobiliary: Couple of subcentimeter fluid density lesions within the liver are noted (2:24, 2:28) these hepatic cysts likely correlate with lesions noted on ultrasound abdomen. Couple of subcentimeter hypodensities are too small to characterize. Calcified gallstones are noted within the gallbladder lumen. No gallbladder wall thickening or pericholecystic fluid. No biliary dilatation. Pancreas: No focal lesion. Normal pancreatic contour. No surrounding inflammatory changes. No main pancreatic ductal dilatation. Spleen: Normal in size without focal  abnormality. Adrenals/Urinary Tract: No adrenal nodule bilaterally. Bilateral kidneys enhance symmetrically. No hydronephrosis. No hydroureter. The urinary bladder is unremarkable. Stomach/Bowel: Stomach is within normal limits. No evidence of bowel wall thickening or dilatation. Scattered colonic diverticulosis. Appendix appears normal. Vascular/Lymphatic: No abdominal aorta or iliac aneurysm. Tortuosity of the iliac vessels is noted. Mild atherosclerotic plaque of the aorta and its branches. No abdominal, pelvic, or inguinal lymphadenopathy. Reproductive: Prostate is unremarkable. Other: No intraperitoneal free fluid. No intraperitoneal free gas. No organized fluid collection. Musculoskeletal: Small to moderate volume fat containing right inguinal hernia with small segment of urinary bladder protruding through the hernia. Small left inguinal hernia with two separate abdominal wall defects noted. No suspicious lytic or blastic osseous lesions. No acute displaced fracture. Multilevel degenerative changes of the spine. Review of the MIP images confirms the above findings. IMPRESSION: 1. No abnormality to explain etiology of patient's symptoms. 2. Heterogeneous 5.2 x 3.9 cm superificial subcutaneus soft tissue lesion anterior to the manubrium. Differential  diagnosis include sebaceous cyst versus less likely but not excluded malignancy. Correlate clinically with physical exam. Given heterogeneity, recommend nonemergent ultrasound. 3. Fluid density lesion within the superficial subcutaneous soft tissues of the upper left/midline back measuring 3.4 x 2.1 cm likely represents a sebaceous cyst. Correlate clinically with physical exam. 4. Cholelithiasis with acute cholecystitis. 5. Scattered colonic diverticulosis with no acute diverticulitis. 6. Small to moderate volume fat containing right inguinal hernia with small segment of urinary bladder protruding through the hernia. 7. Small left inguinal hernia with two separate  abdominal wall defects noted. 8.  Aortic Atherosclerosis (ICD10-I70.0). Electronically Signed   By: Tish Frederickson M.D.   On: 06/03/2020 04:27   CT ABDOMEN PELVIS W CONTRAST  Result Date: 06/03/2020 CLINICAL DATA:  reached down to grab something off of floor and felt something pop and now has pain in right lower rib cage. pain worse with movement and inspiration EXAM: CT ANGIOGRAPHY CHEST CT ABDOMEN AND PELVIS WITH CONTRAST TECHNIQUE: Multidetector CT imaging of the chest was performed using the standard protocol during bolus administration of intravenous contrast. Multiplanar CT image reconstructions and MIPs were obtained to evaluate the vascular anatomy. Multidetector CT imaging of the abdomen and pelvis was performed using the standard protocol during bolus administration of intravenous contrast. CONTRAST:  OMNIPAQUE IOHEXOL 350 MG/ML SOLN COMPARISON:  Ultrasound abdomen 12/30/2014 FINDINGS: CTA CHEST FINDINGS Cardiovascular: Satisfactory opacification of the pulmonary arteries to the segmental level. No evidence of pulmonary embolism. Normal heart size. No significant pericardial effusion. The thoracic aorta is normal in caliber. No atherosclerotic plaque of the thoracic aorta. No coronary artery calcifications. Mediastinum/Nodes: No enlarged mediastinal, hilar, or axillary lymph nodes. Thyroid gland, trachea, and esophagus demonstrate no significant findings. Lungs/Pleura: Right lower lobe linear atelectasis. Left base linear atelectasis. Suggestion of a pulmonary micronodule within the left lower lobe (7:70, 8:82). No pulmonary mass. No pleural effusion. No pneumothorax. Musculoskeletal: Slightly heterogeneous, isodense to muscle, well-circumscribed, 5.2 x 3.9 cm superificial subcutaneus soft tissue lesion anterior to the manubrium (5:22). Fluid density lesion within the superficial subcutaneous soft tissues of the upper left/midline back measuring 3.4 x 2.1 cm (5:28). No suspicious lytic or  blastic osseous lesions. No acute displaced fracture. Multilevel degenerative changes of the spine. Review of the MIP images confirms the above findings. CT ABDOMEN and PELVIS FINDINGS Hepatobiliary: Couple of subcentimeter fluid density lesions within the liver are noted (2:24, 2:28) these hepatic cysts likely correlate with lesions noted on ultrasound abdomen. Couple of subcentimeter hypodensities are too small to characterize. Calcified gallstones are noted within the gallbladder lumen. No gallbladder wall thickening or pericholecystic fluid. No biliary dilatation. Pancreas: No focal lesion. Normal pancreatic contour. No surrounding inflammatory changes. No main pancreatic ductal dilatation. Spleen: Normal in size without focal abnormality. Adrenals/Urinary Tract: No adrenal nodule bilaterally. Bilateral kidneys enhance symmetrically. No hydronephrosis. No hydroureter. The urinary bladder is unremarkable. Stomach/Bowel: Stomach is within normal limits. No evidence of bowel wall thickening or dilatation. Scattered colonic diverticulosis. Appendix appears normal. Vascular/Lymphatic: No abdominal aorta or iliac aneurysm. Tortuosity of the iliac vessels is noted. Mild atherosclerotic plaque of the aorta and its branches. No abdominal, pelvic, or inguinal lymphadenopathy. Reproductive: Prostate is unremarkable. Other: No intraperitoneal free fluid. No intraperitoneal free gas. No organized fluid collection. Musculoskeletal: Small to moderate volume fat containing right inguinal hernia with small segment of urinary bladder protruding through the hernia. Small left inguinal hernia with two separate abdominal wall defects noted. No suspicious lytic or blastic osseous lesions. No acute displaced fracture.  Multilevel degenerative changes of the spine. Review of the MIP images confirms the above findings. IMPRESSION: 1. No abnormality to explain etiology of patient's symptoms. 2. Heterogeneous 5.2 x 3.9 cm superificial  subcutaneus soft tissue lesion anterior to the manubrium. Differential diagnosis include sebaceous cyst versus less likely but not excluded malignancy. Correlate clinically with physical exam. Given heterogeneity, recommend nonemergent ultrasound. 3. Fluid density lesion within the superficial subcutaneous soft tissues of the upper left/midline back measuring 3.4 x 2.1 cm likely represents a sebaceous cyst. Correlate clinically with physical exam. 4. Cholelithiasis with acute cholecystitis. 5. Scattered colonic diverticulosis with no acute diverticulitis. 6. Small to moderate volume fat containing right inguinal hernia with small segment of urinary bladder protruding through the hernia. 7. Small left inguinal hernia with two separate abdominal wall defects noted. 8.  Aortic Atherosclerosis (ICD10-I70.0). Electronically Signed   By: Tish Frederickson M.D.   On: 06/03/2020 04:27   US ABDOMEN LIMITED RUQ (LIVER/GB)  Result Date: 06/03/2020 CLINICAL DATA:  Right upper quadrant pain. EXAM: ULTRASOUND ABDOMEN LIMITED RIGHT UPPER QUADRANT COMPARISON:  CT 06/03/2020.  Ultrasound 12/30/2014. FINDINGS: Gallbladder: Gallstones measuring up to 9 mm. Gallbladder wall thickness 2.3 mm. Negative Murphy sign. Common bile duct: Diameter: 4.9 mm Liver: Increase echogenicity of the liver again noted making evaluation difficult. Increased echogenicity most likely related to fatty infiltration and or hepatocellular disease. 3 small simple cyst again noted. The largest measures 1.9 cm. Similar findings noted on prior exams. Portal vein is patent on color Doppler imaging with normal direction of blood flow towards the liver. Other: Suboptimal exam due to patient's body habitus. IMPRESSION: 1. Gallstones measuring up to 9 mm. No evidence of cholecystitis or biliary distention. 2. Increase echogenicity of the liver consistent with fatty infiltration or hepatocellular disease. Three small simple cyst in the liver again noted. Similar  findings noted on prior exams. Electronically Signed   By: Maisie Fus  Register   On: 06/03/2020 06:21      ____________________________________________   PROCEDURES  Procedure(s) performed:yes .1-3 Lead EKG Interpretation Performed by: Nita Sickle, MD Authorized by: Nita Sickle, MD     Interpretation: non-specific     ECG rate assessment: normal     Rhythm: sinus rhythm     Ectopy: none     Conduction: abnormal     Critical Care performed:  None ____________________________________________   INITIAL IMPRESSION / ASSESSMENT AND PLAN / ED COURSE  65 y.o. male with history of hypertension, hyperlipidemia, obesity, umbilical hernia status post repair in 2013 who presents for evaluation of right upper quadrant abdominal/ right lower chest pain that started abruptly when patient bent over to pick up something on the floor.  On exam he is in no respiratory distress.  Hypertensive in the setting of pain with intact pulses in all 4 extremities.  He points to the right side of his abdomen.  Due to body habitus exam is limited.  No obvious hernia palpable or tenderness on exam.  His EKG shows right bundle branch block which is unchanged from prior.  Ddx hernia vs intercostal strain vs rib fracture vs PTX vs PE vs dissection.   Will give IV fentanyl and zofran for symptoms relief. Will get CT c/a/p.  Initial troponin negative at 16.  Labs with no other significant abnormalities, no leukocytosis.  Chest x-ray visualized by me with no acute findings, confirmed by radiology.   Patient placed on telemetry for monitoring of cardiorespiratory status.  Old medical records reviewed   _________________________ 6:42 AM on  06/03/2020 -----------------------------------------  Labs including chemistry panel, LFTs, high-sensitivity troponins x2, CBC all unremarkable.  CT showed cholelithiasis but no other acute findings.  Patient does have a cyst located anteriorly to the manubrium which  she has had for several years.  He has been told by his PCP that this is a cyst.  I discussed with him the findings on CT and recommendation of radiologist for an ultrasound and possible drainage to rule out malignancy.  Patient was then sent for right upper quadrant ultrasound that showed gallstones with no evidence of cholecystitis.  Pain is most likely from a muscle strain.  Will discharge home on Robaxin, short course of tramadol and Tylenol.  Recommended close follow-up with PCP and discussed my standard return precautions.    _____________________________________________ Please note:  Patient was evaluated in Emergency Department today for the symptoms described in the history of present illness. Patient was evaluated in the context of the global COVID-19 pandemic, which necessitated consideration that the patient might be at risk for infection with the SARS-CoV-2 virus that causes COVID-19. Institutional protocols and algorithms that pertain to the evaluation of patients at risk for COVID-19 are in a state of rapid change based on information released by regulatory bodies including the CDC and federal and state organizations. These policies and algorithms were followed during the patient's care in the ED.  Some ED evaluations and interventions may be delayed as a result of limited staffing during the pandemic.   [place] Controlled Substance Database was reviewed by me. ____________________________________________   FINAL CLINICAL IMPRESSION(S) / ED DIAGNOSES   Final diagnoses:  RUQ abdominal pain  Intercostal muscle strain, initial encounter  Calculus of gallbladder without cholecystitis without obstruction      NEW MEDICATIONS STARTED DURING THIS VISIT:  ED Discharge Orders         Ordered    traMADol (ULTRAM) 50 MG tablet  Every 6 hours PRN        06/03/20 0641    acetaminophen (TYLENOL) 500 MG tablet  Every 8 hours PRN        06/03/20 0641    methocarbamol (ROBAXIN) 500 MG tablet   Every 6 hours PRN        06/03/20 0641           Note:  This document was prepared using Dragon voice recognition software and may include unintentional dictation errors.    Don Perking, [place], MD 06/03/20 [phone]

## 2020-06-03 NOTE — ED Triage Notes (Signed)
Pt states he reached down to grab something off of floor and felt something pop and now has pain in right lower rib cage. Pt states pain worse with movement and inspiration. Pt has no cardiac hx.

## 2020-06-03 NOTE — Discharge Instructions (Addendum)
As we discussed, follow-up with your primary care doctor for drainage and ultrasound of the cyst on your chest to ensure that this is not something more concerning like cancer.  Take 1000 mg of Tylenol and 50 of tramadol as needed for pain.  Apply heat to the area.  Return to the emergency room for cough, fever, chest pain or shortness of breath.  Otherwise follow-up with your doctor in a few days

## 2020-06-03 NOTE — ED Notes (Signed)
Pt now returned from CT  - calm and quiet; no acute distress.  Wife at bedside

## 2020-07-13 DIAGNOSIS — G4700 Insomnia, unspecified: Secondary | ICD-10-CM | POA: Diagnosis not present

## 2020-07-13 DIAGNOSIS — E7800 Pure hypercholesterolemia, unspecified: Secondary | ICD-10-CM | POA: Diagnosis not present

## 2020-07-13 DIAGNOSIS — E1169 Type 2 diabetes mellitus with other specified complication: Secondary | ICD-10-CM | POA: Diagnosis not present

## 2020-07-13 DIAGNOSIS — M170 Bilateral primary osteoarthritis of knee: Secondary | ICD-10-CM | POA: Diagnosis not present

## 2020-07-13 DIAGNOSIS — I10 Essential (primary) hypertension: Secondary | ICD-10-CM | POA: Diagnosis not present

## 2020-08-03 DIAGNOSIS — M170 Bilateral primary osteoarthritis of knee: Secondary | ICD-10-CM | POA: Diagnosis not present

## 2020-08-03 DIAGNOSIS — I10 Essential (primary) hypertension: Secondary | ICD-10-CM | POA: Diagnosis not present

## 2020-08-03 DIAGNOSIS — E1169 Type 2 diabetes mellitus with other specified complication: Secondary | ICD-10-CM | POA: Diagnosis not present

## 2020-08-03 DIAGNOSIS — G4700 Insomnia, unspecified: Secondary | ICD-10-CM | POA: Diagnosis not present

## 2020-08-03 DIAGNOSIS — E7800 Pure hypercholesterolemia, unspecified: Secondary | ICD-10-CM | POA: Diagnosis not present

## 2020-09-02 DIAGNOSIS — E7800 Pure hypercholesterolemia, unspecified: Secondary | ICD-10-CM | POA: Diagnosis not present

## 2020-09-02 DIAGNOSIS — E1169 Type 2 diabetes mellitus with other specified complication: Secondary | ICD-10-CM | POA: Diagnosis not present

## 2020-09-02 DIAGNOSIS — I10 Essential (primary) hypertension: Secondary | ICD-10-CM | POA: Diagnosis not present

## 2020-09-02 DIAGNOSIS — M170 Bilateral primary osteoarthritis of knee: Secondary | ICD-10-CM | POA: Diagnosis not present

## 2020-09-02 DIAGNOSIS — G4700 Insomnia, unspecified: Secondary | ICD-10-CM | POA: Diagnosis not present

## 2020-09-14 DIAGNOSIS — K2270 Barrett's esophagus without dysplasia: Secondary | ICD-10-CM | POA: Diagnosis not present

## 2020-09-14 DIAGNOSIS — J392 Other diseases of pharynx: Secondary | ICD-10-CM | POA: Diagnosis not present

## 2020-09-14 DIAGNOSIS — G4733 Obstructive sleep apnea (adult) (pediatric): Secondary | ICD-10-CM | POA: Diagnosis not present

## 2020-09-24 DIAGNOSIS — E7800 Pure hypercholesterolemia, unspecified: Secondary | ICD-10-CM | POA: Diagnosis not present

## 2020-09-24 DIAGNOSIS — I10 Essential (primary) hypertension: Secondary | ICD-10-CM | POA: Diagnosis not present

## 2020-09-24 DIAGNOSIS — G4700 Insomnia, unspecified: Secondary | ICD-10-CM | POA: Diagnosis not present

## 2020-09-24 DIAGNOSIS — M170 Bilateral primary osteoarthritis of knee: Secondary | ICD-10-CM | POA: Diagnosis not present

## 2020-09-24 DIAGNOSIS — E1169 Type 2 diabetes mellitus with other specified complication: Secondary | ICD-10-CM | POA: Diagnosis not present

## 2020-10-27 DIAGNOSIS — E7800 Pure hypercholesterolemia, unspecified: Secondary | ICD-10-CM | POA: Diagnosis not present

## 2020-10-27 DIAGNOSIS — Z1389 Encounter for screening for other disorder: Secondary | ICD-10-CM | POA: Diagnosis not present

## 2020-10-27 DIAGNOSIS — K219 Gastro-esophageal reflux disease without esophagitis: Secondary | ICD-10-CM | POA: Diagnosis not present

## 2020-10-27 DIAGNOSIS — E1169 Type 2 diabetes mellitus with other specified complication: Secondary | ICD-10-CM | POA: Diagnosis not present

## 2020-10-27 DIAGNOSIS — Z0000 Encounter for general adult medical examination without abnormal findings: Secondary | ICD-10-CM | POA: Diagnosis not present

## 2020-10-27 DIAGNOSIS — R609 Edema, unspecified: Secondary | ICD-10-CM | POA: Diagnosis not present

## 2020-10-27 DIAGNOSIS — M170 Bilateral primary osteoarthritis of knee: Secondary | ICD-10-CM | POA: Diagnosis not present

## 2020-10-27 DIAGNOSIS — G4700 Insomnia, unspecified: Secondary | ICD-10-CM | POA: Diagnosis not present

## 2020-10-27 DIAGNOSIS — I10 Essential (primary) hypertension: Secondary | ICD-10-CM | POA: Diagnosis not present

## 2020-11-16 DIAGNOSIS — L720 Epidermal cyst: Secondary | ICD-10-CM | POA: Diagnosis not present

## 2020-11-24 DIAGNOSIS — K259 Gastric ulcer, unspecified as acute or chronic, without hemorrhage or perforation: Secondary | ICD-10-CM | POA: Diagnosis not present

## 2020-11-24 DIAGNOSIS — K2980 Duodenitis without bleeding: Secondary | ICD-10-CM | POA: Diagnosis not present

## 2020-11-24 DIAGNOSIS — K2289 Other specified disease of esophagus: Secondary | ICD-10-CM | POA: Diagnosis not present

## 2020-11-24 DIAGNOSIS — K319 Disease of stomach and duodenum, unspecified: Secondary | ICD-10-CM | POA: Diagnosis not present

## 2020-11-24 DIAGNOSIS — K22710 Barrett's esophagus with low grade dysplasia: Secondary | ICD-10-CM | POA: Diagnosis not present

## 2020-11-24 DIAGNOSIS — K317 Polyp of stomach and duodenum: Secondary | ICD-10-CM | POA: Diagnosis not present

## 2020-11-24 DIAGNOSIS — K2270 Barrett's esophagus without dysplasia: Secondary | ICD-10-CM | POA: Diagnosis not present

## 2020-11-24 DIAGNOSIS — K3189 Other diseases of stomach and duodenum: Secondary | ICD-10-CM | POA: Diagnosis not present

## 2020-11-29 DIAGNOSIS — K2980 Duodenitis without bleeding: Secondary | ICD-10-CM | POA: Diagnosis not present

## 2020-11-29 DIAGNOSIS — K2270 Barrett's esophagus without dysplasia: Secondary | ICD-10-CM | POA: Diagnosis not present

## 2020-11-29 DIAGNOSIS — K319 Disease of stomach and duodenum, unspecified: Secondary | ICD-10-CM | POA: Diagnosis not present

## 2021-01-12 DIAGNOSIS — I10 Essential (primary) hypertension: Secondary | ICD-10-CM | POA: Diagnosis not present

## 2021-01-12 DIAGNOSIS — R609 Edema, unspecified: Secondary | ICD-10-CM | POA: Diagnosis not present

## 2021-01-13 DIAGNOSIS — K22711 Barrett's esophagus with high grade dysplasia: Secondary | ICD-10-CM | POA: Diagnosis not present

## 2021-02-02 DIAGNOSIS — L723 Sebaceous cyst: Secondary | ICD-10-CM | POA: Diagnosis not present

## 2021-02-02 DIAGNOSIS — R609 Edema, unspecified: Secondary | ICD-10-CM | POA: Diagnosis not present

## 2021-02-02 DIAGNOSIS — I10 Essential (primary) hypertension: Secondary | ICD-10-CM | POA: Diagnosis not present

## 2021-02-02 DIAGNOSIS — R739 Hyperglycemia, unspecified: Secondary | ICD-10-CM | POA: Diagnosis not present

## 2021-02-11 DIAGNOSIS — Z23 Encounter for immunization: Secondary | ICD-10-CM | POA: Diagnosis not present

## 2021-03-15 DIAGNOSIS — E119 Type 2 diabetes mellitus without complications: Secondary | ICD-10-CM | POA: Diagnosis not present

## 2021-03-15 DIAGNOSIS — K22711 Barrett's esophagus with high grade dysplasia: Secondary | ICD-10-CM | POA: Diagnosis not present

## 2021-03-17 DIAGNOSIS — G4700 Insomnia, unspecified: Secondary | ICD-10-CM | POA: Diagnosis not present

## 2021-03-17 DIAGNOSIS — K219 Gastro-esophageal reflux disease without esophagitis: Secondary | ICD-10-CM | POA: Diagnosis not present

## 2021-03-17 DIAGNOSIS — E7800 Pure hypercholesterolemia, unspecified: Secondary | ICD-10-CM | POA: Diagnosis not present

## 2021-03-17 DIAGNOSIS — I10 Essential (primary) hypertension: Secondary | ICD-10-CM | POA: Diagnosis not present

## 2021-03-17 DIAGNOSIS — E1169 Type 2 diabetes mellitus with other specified complication: Secondary | ICD-10-CM | POA: Diagnosis not present

## 2021-03-17 DIAGNOSIS — M170 Bilateral primary osteoarthritis of knee: Secondary | ICD-10-CM | POA: Diagnosis not present

## 2021-04-04 DIAGNOSIS — L723 Sebaceous cyst: Secondary | ICD-10-CM | POA: Diagnosis not present

## 2021-05-23 DIAGNOSIS — I700 Atherosclerosis of aorta: Secondary | ICD-10-CM | POA: Diagnosis not present

## 2021-05-23 DIAGNOSIS — E1169 Type 2 diabetes mellitus with other specified complication: Secondary | ICD-10-CM | POA: Diagnosis not present

## 2021-05-23 DIAGNOSIS — M170 Bilateral primary osteoarthritis of knee: Secondary | ICD-10-CM | POA: Diagnosis not present

## 2021-05-23 DIAGNOSIS — E7800 Pure hypercholesterolemia, unspecified: Secondary | ICD-10-CM | POA: Diagnosis not present

## 2021-05-23 DIAGNOSIS — I10 Essential (primary) hypertension: Secondary | ICD-10-CM | POA: Diagnosis not present

## 2021-05-23 DIAGNOSIS — G4700 Insomnia, unspecified: Secondary | ICD-10-CM | POA: Diagnosis not present

## 2021-05-23 DIAGNOSIS — R609 Edema, unspecified: Secondary | ICD-10-CM | POA: Diagnosis not present

## 2021-06-14 DIAGNOSIS — E785 Hyperlipidemia, unspecified: Secondary | ICD-10-CM | POA: Diagnosis not present

## 2021-06-14 DIAGNOSIS — I10 Essential (primary) hypertension: Secondary | ICD-10-CM | POA: Diagnosis not present

## 2021-06-14 DIAGNOSIS — Z7984 Long term (current) use of oral hypoglycemic drugs: Secondary | ICD-10-CM | POA: Diagnosis not present

## 2021-06-14 DIAGNOSIS — K222 Esophageal obstruction: Secondary | ICD-10-CM | POA: Diagnosis not present

## 2021-06-14 DIAGNOSIS — K22711 Barrett's esophagus with high grade dysplasia: Secondary | ICD-10-CM | POA: Diagnosis not present

## 2021-06-14 DIAGNOSIS — E119 Type 2 diabetes mellitus without complications: Secondary | ICD-10-CM | POA: Diagnosis not present

## 2021-06-14 DIAGNOSIS — K2270 Barrett's esophagus without dysplasia: Secondary | ICD-10-CM | POA: Diagnosis not present

## 2021-06-15 DIAGNOSIS — E1169 Type 2 diabetes mellitus with other specified complication: Secondary | ICD-10-CM | POA: Diagnosis not present

## 2021-09-05 DIAGNOSIS — H60501 Unspecified acute noninfective otitis externa, right ear: Secondary | ICD-10-CM | POA: Diagnosis not present

## 2021-09-05 DIAGNOSIS — H6122 Impacted cerumen, left ear: Secondary | ICD-10-CM | POA: Diagnosis not present

## 2021-09-06 DIAGNOSIS — Z7984 Long term (current) use of oral hypoglycemic drugs: Secondary | ICD-10-CM | POA: Diagnosis not present

## 2021-09-06 DIAGNOSIS — G4733 Obstructive sleep apnea (adult) (pediatric): Secondary | ICD-10-CM | POA: Diagnosis not present

## 2021-09-06 DIAGNOSIS — K22711 Barrett's esophagus with high grade dysplasia: Secondary | ICD-10-CM | POA: Diagnosis not present

## 2021-09-06 DIAGNOSIS — E119 Type 2 diabetes mellitus without complications: Secondary | ICD-10-CM | POA: Diagnosis not present

## 2021-09-06 DIAGNOSIS — E785 Hyperlipidemia, unspecified: Secondary | ICD-10-CM | POA: Diagnosis not present

## 2021-09-06 DIAGNOSIS — Z8719 Personal history of other diseases of the digestive system: Secondary | ICD-10-CM | POA: Diagnosis not present

## 2021-09-06 DIAGNOSIS — K208 Other esophagitis without bleeding: Secondary | ICD-10-CM | POA: Diagnosis not present

## 2021-09-06 DIAGNOSIS — K209 Esophagitis, unspecified without bleeding: Secondary | ICD-10-CM | POA: Diagnosis not present

## 2021-09-06 DIAGNOSIS — I10 Essential (primary) hypertension: Secondary | ICD-10-CM | POA: Diagnosis not present

## 2021-09-08 DIAGNOSIS — H60501 Unspecified acute noninfective otitis externa, right ear: Secondary | ICD-10-CM | POA: Diagnosis not present

## 2021-09-22 DIAGNOSIS — H43811 Vitreous degeneration, right eye: Secondary | ICD-10-CM | POA: Diagnosis not present

## 2021-09-22 DIAGNOSIS — H2513 Age-related nuclear cataract, bilateral: Secondary | ICD-10-CM | POA: Diagnosis not present

## 2021-09-22 DIAGNOSIS — E119 Type 2 diabetes mellitus without complications: Secondary | ICD-10-CM | POA: Diagnosis not present

## 2021-09-22 DIAGNOSIS — H40012 Open angle with borderline findings, low risk, left eye: Secondary | ICD-10-CM | POA: Diagnosis not present

## 2021-09-22 DIAGNOSIS — H1045 Other chronic allergic conjunctivitis: Secondary | ICD-10-CM | POA: Diagnosis not present

## 2021-09-22 DIAGNOSIS — H04123 Dry eye syndrome of bilateral lacrimal glands: Secondary | ICD-10-CM | POA: Diagnosis not present

## 2021-10-04 DIAGNOSIS — M170 Bilateral primary osteoarthritis of knee: Secondary | ICD-10-CM | POA: Diagnosis not present

## 2021-10-04 DIAGNOSIS — E1169 Type 2 diabetes mellitus with other specified complication: Secondary | ICD-10-CM | POA: Diagnosis not present

## 2021-10-04 DIAGNOSIS — I10 Essential (primary) hypertension: Secondary | ICD-10-CM | POA: Diagnosis not present

## 2021-10-04 DIAGNOSIS — E7800 Pure hypercholesterolemia, unspecified: Secondary | ICD-10-CM | POA: Diagnosis not present

## 2021-10-04 DIAGNOSIS — G4700 Insomnia, unspecified: Secondary | ICD-10-CM | POA: Diagnosis not present

## 2021-10-04 DIAGNOSIS — K219 Gastro-esophageal reflux disease without esophagitis: Secondary | ICD-10-CM | POA: Diagnosis not present

## 2021-12-05 DIAGNOSIS — G4733 Obstructive sleep apnea (adult) (pediatric): Secondary | ICD-10-CM | POA: Diagnosis not present

## 2021-12-05 DIAGNOSIS — G4700 Insomnia, unspecified: Secondary | ICD-10-CM | POA: Diagnosis not present

## 2021-12-05 DIAGNOSIS — I700 Atherosclerosis of aorta: Secondary | ICD-10-CM | POA: Diagnosis not present

## 2021-12-05 DIAGNOSIS — E1169 Type 2 diabetes mellitus with other specified complication: Secondary | ICD-10-CM | POA: Diagnosis not present

## 2021-12-05 DIAGNOSIS — M170 Bilateral primary osteoarthritis of knee: Secondary | ICD-10-CM | POA: Diagnosis not present

## 2021-12-05 DIAGNOSIS — Z0000 Encounter for general adult medical examination without abnormal findings: Secondary | ICD-10-CM | POA: Diagnosis not present

## 2021-12-05 DIAGNOSIS — Z23 Encounter for immunization: Secondary | ICD-10-CM | POA: Diagnosis not present

## 2021-12-05 DIAGNOSIS — I10 Essential (primary) hypertension: Secondary | ICD-10-CM | POA: Diagnosis not present

## 2021-12-05 DIAGNOSIS — E7800 Pure hypercholesterolemia, unspecified: Secondary | ICD-10-CM | POA: Diagnosis not present

## 2021-12-05 DIAGNOSIS — K219 Gastro-esophageal reflux disease without esophagitis: Secondary | ICD-10-CM | POA: Diagnosis not present

## 2021-12-28 DIAGNOSIS — E876 Hypokalemia: Secondary | ICD-10-CM | POA: Diagnosis not present

## 2022-01-17 DIAGNOSIS — M47816 Spondylosis without myelopathy or radiculopathy, lumbar region: Secondary | ICD-10-CM | POA: Diagnosis not present

## 2022-02-22 DIAGNOSIS — M5416 Radiculopathy, lumbar region: Secondary | ICD-10-CM | POA: Diagnosis not present

## 2022-02-22 DIAGNOSIS — M5136 Other intervertebral disc degeneration, lumbar region: Secondary | ICD-10-CM | POA: Diagnosis not present

## 2022-02-28 DIAGNOSIS — M5416 Radiculopathy, lumbar region: Secondary | ICD-10-CM | POA: Diagnosis not present

## 2022-03-15 DIAGNOSIS — E7800 Pure hypercholesterolemia, unspecified: Secondary | ICD-10-CM | POA: Diagnosis not present

## 2022-03-15 DIAGNOSIS — E1169 Type 2 diabetes mellitus with other specified complication: Secondary | ICD-10-CM | POA: Diagnosis not present

## 2022-03-15 DIAGNOSIS — G4700 Insomnia, unspecified: Secondary | ICD-10-CM | POA: Diagnosis not present

## 2022-03-15 DIAGNOSIS — I10 Essential (primary) hypertension: Secondary | ICD-10-CM | POA: Diagnosis not present

## 2022-04-04 DIAGNOSIS — C160 Malignant neoplasm of cardia: Secondary | ICD-10-CM | POA: Diagnosis not present

## 2022-04-04 DIAGNOSIS — E785 Hyperlipidemia, unspecified: Secondary | ICD-10-CM | POA: Diagnosis not present

## 2022-04-04 DIAGNOSIS — E119 Type 2 diabetes mellitus without complications: Secondary | ICD-10-CM | POA: Diagnosis not present

## 2022-04-04 DIAGNOSIS — G4733 Obstructive sleep apnea (adult) (pediatric): Secondary | ICD-10-CM | POA: Diagnosis not present

## 2022-04-04 DIAGNOSIS — K22711 Barrett's esophagus with high grade dysplasia: Secondary | ICD-10-CM | POA: Diagnosis not present

## 2022-04-04 DIAGNOSIS — I10 Essential (primary) hypertension: Secondary | ICD-10-CM | POA: Diagnosis not present

## 2022-04-08 DIAGNOSIS — I491 Atrial premature depolarization: Secondary | ICD-10-CM | POA: Diagnosis not present

## 2022-04-08 DIAGNOSIS — I4510 Unspecified right bundle-branch block: Secondary | ICD-10-CM | POA: Diagnosis not present

## 2022-04-12 DIAGNOSIS — K22711 Barrett's esophagus with high grade dysplasia: Secondary | ICD-10-CM | POA: Diagnosis not present

## 2022-04-12 DIAGNOSIS — R002 Palpitations: Secondary | ICD-10-CM | POA: Diagnosis not present

## 2022-04-12 DIAGNOSIS — I10 Essential (primary) hypertension: Secondary | ICD-10-CM | POA: Diagnosis not present

## 2022-04-12 DIAGNOSIS — G4733 Obstructive sleep apnea (adult) (pediatric): Secondary | ICD-10-CM | POA: Diagnosis not present

## 2022-04-12 DIAGNOSIS — E7800 Pure hypercholesterolemia, unspecified: Secondary | ICD-10-CM | POA: Diagnosis not present

## 2022-04-12 DIAGNOSIS — E1169 Type 2 diabetes mellitus with other specified complication: Secondary | ICD-10-CM | POA: Diagnosis not present

## 2022-04-19 DIAGNOSIS — E1169 Type 2 diabetes mellitus with other specified complication: Secondary | ICD-10-CM | POA: Diagnosis not present

## 2022-04-19 DIAGNOSIS — E7800 Pure hypercholesterolemia, unspecified: Secondary | ICD-10-CM | POA: Diagnosis not present

## 2022-04-19 DIAGNOSIS — I10 Essential (primary) hypertension: Secondary | ICD-10-CM | POA: Diagnosis not present

## 2022-05-02 ENCOUNTER — Ambulatory Visit: Payer: Medicare Other | Admitting: Internal Medicine

## 2022-05-02 ENCOUNTER — Encounter: Payer: Self-pay | Admitting: Internal Medicine

## 2022-05-02 VITALS — BP 137/74 | HR 100 | Ht 73.0 in | Wt 297.0 lb

## 2022-05-02 DIAGNOSIS — I4510 Unspecified right bundle-branch block: Secondary | ICD-10-CM | POA: Diagnosis not present

## 2022-05-02 DIAGNOSIS — I491 Atrial premature depolarization: Secondary | ICD-10-CM | POA: Diagnosis not present

## 2022-05-02 DIAGNOSIS — I10 Essential (primary) hypertension: Secondary | ICD-10-CM

## 2022-05-02 DIAGNOSIS — R9431 Abnormal electrocardiogram [ECG] [EKG]: Secondary | ICD-10-CM | POA: Diagnosis not present

## 2022-05-02 MED ORDER — METOPROLOL SUCCINATE ER 50 MG PO TB24: 50.0000 mg | ORAL_TABLET | Freq: Every day | ORAL | 2 refills | Status: DC

## 2022-05-02 NOTE — Progress Notes (Signed)
Primary Physician/Referring:  Johny Blamer, MD  Patient ID: Noah Austin, male    DOB: 1955-04-23, 67 y.o.   MRN: [id number]  Chief Complaint  Patient presents with   pac   Right bundle branch block    New Patient (Initial Visit)   HPI:    Noah Austin  is a 67 y.o. male with past medical history significant for hypertension and hyperlipidemia who is here to establish care with cardiology due to palpitations and feeling of lightheadedness.  Patient was also found to have an abnormal EKG by his PCP.  He says he has never had fast heart rates in the past and he has never felt like this before either.  He does not have chest pain or shortness of breath when he exerts himself.  He denies diaphoresis, syncope, edema, orthopnea, PND, claudication.  Past Medical History:  Diagnosis Date   Dyslipidemia    Hyperlipidemia    Hypertension    Obesity (BMI 35.0-39.9 without comorbidity)    Past Surgical History:  Procedure Laterality Date   HERNIA REPAIR  07/17/11   umbilical hernia   left inguinal hernia     Repair about 27 years ago, about 25 yrs for 2nd surgery   SHOULDER OPEN ROTATOR CUFF REPAIR Right 05/14/2014   Procedure: RIGHT SHOULDER MINI OPEN ROTATOR CUFF REPAIR ;  Surgeon: Javier Docker, MD;  Location: [facility];  Service: Orthopedics;  Laterality: Right;   UMBILICAL HERNIA REPAIR  07/17/2011   Procedure: HERNIA REPAIR UMBILICAL ADULT;  Surgeon: Ernestene Mention, MD;  Location: [facility];  Service: General;  Laterality: N/A;  repair strangulated umbilical hernia   Family History  Problem Relation Age of Onset   Cancer Maternal Grandmother        colon    Social History   Tobacco Use   Smoking status: Former    Years: 10.00    Types: Cigarettes    Quit date: 07/16/1988    Years since quitting: 33.8   Smokeless tobacco: Never  Substance Use Topics   Alcohol use: No   Marital Status: Married  ROS  Review of Systems  Cardiovascular:  Positive for irregular heartbeat  and palpitations.  Neurological:  Positive for light-headedness.   Objective  Blood pressure 137/74, pulse 100, height 6\' 1"  (1.854 m), weight 297 lb (134.7 kg), SpO2 94 %. Body mass index is 39.18 kg/m.     05/02/2022    9:35 AM 06/03/2020    6:30 AM 06/03/2020    6:00 AM  Vitals with BMI  Height 6\' 1"     Weight 297 lbs    BMI 39.19    Systolic 137 124 06/05/2020  Diastolic 74 64 77  Pulse 100 67 71     Physical Exam Vitals reviewed.  HENT:     Head: Normocephalic and atraumatic.  Cardiovascular:     Rate and Rhythm: Regular rhythm. Tachycardia present.     Pulses: Normal pulses.     Heart sounds: Normal heart sounds. No murmur heard. Pulmonary:     Effort: Pulmonary effort is normal.     Breath sounds: Normal breath sounds.  Abdominal:     General: Bowel sounds are normal.  Musculoskeletal:     Right lower leg: No edema.     Left lower leg: No edema.  Skin:    General: Skin is warm and dry.  Neurological:     Mental Status: He is alert.    Medications and allergies  Allergies  Allergen Reactions   Codeine Itching   Amlodipine Besylate Other (See Comments)   Hydrochlorothiazide Other (See Comments)     Medication list after today's encounter   Current Outpatient Medications:    aspirin 81 MG tablet, Take 81 mg by mouth daily., Disp: , Rfl:    furosemide (LASIX) 20 MG tablet, , Disp: , Rfl:    indapamide (LOZOL) 2.5 MG tablet, Take 1 tablet by mouth every morning., Disp: , Rfl:    lisinopril (ZESTRIL) 20 MG tablet, , Disp: , Rfl:    metFORMIN (GLUCOPHAGE-XR) 500 MG 24 hr tablet, TAKE 1 TABLET BY MOUTH TWICE DAILY FOR 15 DAYS, Disp: , Rfl:    methocarbamol (ROBAXIN) 500 MG tablet, Take 1 tablet (500 mg total) by mouth every 6 (six) hours as needed for muscle spasms., Disp: 30 tablet, Rfl: 0   metoprolol succinate (TOPROL-XL) 50 MG 24 hr tablet, Take 1 tablet (50 mg total) by mouth at bedtime. Take with or immediately following a meal., Disp: 90 tablet, Rfl: 2   Misc  Natural Products (OSTEO BI-FLEX ADV DOUBLE ST PO), Take by mouth., Disp: , Rfl:    Multiple Vitamins-Minerals (MULTIVITAMIN WITH MINERALS) tablet, Take 1 tablet by mouth daily., Disp: , Rfl:    Omega-3 Fatty Acids (OMEGA 3 PO), Take 2 capsules by mouth daily., Disp: , Rfl:    pantoprazole (PROTONIX) 20 MG tablet, TAKE 1 TABLET BY MOUTH ONCE DAILY 30 MINUTES BEFORE BREAKFAST (MUST MAKE APPOINTMENT FOR FUTURE REFILLS), Disp: , Rfl:    pravastatin (PRAVACHOL) 40 MG tablet, Take 40 mg by mouth at bedtime. , Disp: , Rfl:    zolpidem (AMBIEN) 10 MG tablet, Take 10 mg by mouth at bedtime., Disp: , Rfl:    tadalafil (CIALIS) 5 MG tablet, Take 1 tablet by mouth daily., Disp: , Rfl:   Laboratory examination:   Lab Results  Component Value Date   NA 140 06/03/2020   K 3.5 06/03/2020   CO2 26 06/03/2020   GLUCOSE 124 (H) 06/03/2020   BUN 22 06/03/2020   CREATININE 0.94 06/03/2020   CALCIUM 9.0 06/03/2020   GFRNONAA >60 06/03/2020       Latest Ref Rng & Units 06/03/2020    4:05 AM 06/03/2020    1:14 AM 07/03/2018    8:34 PM  CMP  Glucose 70 - 99 mg/dL  124  118   BUN 8 - 23 mg/dL  22  24   Creatinine 0.61 - 1.24 mg/dL  0.94  0.83   Sodium 135 - 145 mmol/L  140  138   Potassium 3.5 - 5.1 mmol/L  3.5  3.3   Chloride 98 - 111 mmol/L  104  103   CO2 22 - 32 mmol/L  26  24   Calcium 8.9 - 10.3 mg/dL  9.0  8.9   Total Protein 6.5 - 8.1 g/dL 6.3   7.1   Total Bilirubin 0.3 - 1.2 mg/dL 1.0   1.1   Alkaline Phos 38 - 126 U/L 50   58   AST 15 - 41 U/L 27   34   ALT 0 - 44 U/L 29   42       Latest Ref Rng & Units 06/03/2020    1:14 AM 07/03/2018    8:34 PM 05/15/2014    5:35 AM  CBC  WBC 4.0 - 10.5 K/uL 5.9  7.1  13.4   Hemoglobin 13.0 - 17.0 g/dL 15.2  15.4  13.6   Hematocrit  39.0 - 52.0 % 44.3  43.5  38.9   Platelets 150 - 400 K/uL 205  222  194     Lipid Panel No results for input(s): "CHOL", "TRIG", "LDLCALC", "VLDL", "HDL", "CHOLHDL", "LDLDIRECT" in the last 8760 hours.  HEMOGLOBIN  A1C No results found for: "HGBA1C", "MPG" TSH No results for input(s): "TSH" in the last 8760 hours.  External labs:     Radiology:    Cardiac Studies:     EKG:   05/02/2022: Sinus Tachycardia with sinus arrhythmia. left axis with RBBB and LAFB.   Assessment     ICD-10-CM   1. PAC (premature atrial contraction)  I49.1 EKG 12-Lead    [facility] ECHOCARDIOGRAM COMPLETE    LONG TERM MONITOR (3-14 DAYS)    2. RBBB (right bundle branch block)  I45.10 EKG 12-Lead    [facility]V ECHOCARDIOGRAM COMPLETE    LONG TERM MONITOR (3-14 DAYS)    3. Essential hypertension  I10 [facility] ECHOCARDIOGRAM COMPLETE    4. Nonspecific abnormal electrocardiogram (ECG) (EKG)  R94.31        Orders Placed This Encounter  Procedures   LONG TERM MONITOR (3-14 DAYS)    Standing Status:   Future    Number of Occurrences:   1    Standing Expiration Date:   05/03/2023    Order Specific Question:   Where should this test be performed?    Answer:   [facility]    Order Specific Question:   Does the patient have an implanted cardiac device?    Answer:   No    Order Specific Question:   Prescribed days of wear    Answer:   44    Order Specific Question:   Type of enrollment    Answer:   Clinic Enrollment    Order Specific Question:   Release to patient    Answer:   Immediate   EKG 12-Lead   [facility] ECHOCARDIOGRAM COMPLETE    Standing Status:   Future    Standing Expiration Date:   05/03/2023    Meds ordered this encounter  Medications   metoprolol succinate (TOPROL-XL) 50 MG 24 hr tablet    Sig: Take 1 tablet (50 mg total) by mouth at bedtime. Take with or immediately following a meal.    Dispense:  90 tablet    Refill:  2    Medications Discontinued During This Encounter  Medication Reason   pantoprazole (PROTONIX) 40 MG tablet Duplicate   lisinopril-hydrochlorothiazide (PRINZIDE,ZESTORETIC) 20-25 MG per tablet    ranitidine (ZANTAC) 150 MG tablet Completed Course   potassium chloride SA (KLOR-CON M)  20 MEQ tablet Completed Course   promethazine-dextromethorphan (PROMETHAZINE-DM) 6.25-15 MG/5ML syrup    sucralfate (CARAFATE) 1 g tablet Completed Course   traMADol (ULTRAM) 50 MG tablet    amLODipine (NORVASC) 5 MG tablet    docusate sodium (COLACE) 100 MG capsule Completed Course   hydrOXYzine (ATARAX) 25 MG tablet    metoprolol succinate (TOPROL-XL) 25 MG 24 hr tablet      Recommendations:   Noah Austin is a 67 y.o.  male with abnormal EKG  PAC (premature atrial contraction) RBBB (right bundle branch block) Event monitor ordered   Essential hypertension Continue current cardiac medications. Encourage low-sodium diet, less than 2000 mg daily. BP very well controlled on this regiment   Nonspecific abnormal electrocardiogram (ECG) (EKG) Echocardiogram ordered  Follow-up in 1-2 months or sooner if needed     Floydene Flock, DO, [facility]  05/03/2022, 2:48 PM  Office: [phone] Pager: [phone]

## 2022-05-04 ENCOUNTER — Ambulatory Visit: Payer: Medicare Other

## 2022-05-04 DIAGNOSIS — I4510 Unspecified right bundle-branch block: Secondary | ICD-10-CM | POA: Diagnosis not present

## 2022-05-04 DIAGNOSIS — I10 Essential (primary) hypertension: Secondary | ICD-10-CM

## 2022-05-04 DIAGNOSIS — I491 Atrial premature depolarization: Secondary | ICD-10-CM

## 2022-05-08 NOTE — Progress Notes (Signed)
Called patient no answer left a vm

## 2022-05-08 NOTE — Progress Notes (Signed)
normal

## 2022-05-10 NOTE — Progress Notes (Signed)
Gave patient results, he acknowledged understanding and had no further questions.

## 2022-05-11 DIAGNOSIS — R6884 Jaw pain: Secondary | ICD-10-CM | POA: Diagnosis not present

## 2022-05-23 DIAGNOSIS — K22711 Barrett's esophagus with high grade dysplasia: Secondary | ICD-10-CM | POA: Diagnosis not present

## 2022-05-23 DIAGNOSIS — K229 Disease of esophagus, unspecified: Secondary | ICD-10-CM | POA: Diagnosis not present

## 2022-05-23 DIAGNOSIS — E119 Type 2 diabetes mellitus without complications: Secondary | ICD-10-CM | POA: Diagnosis not present

## 2022-05-23 DIAGNOSIS — K449 Diaphragmatic hernia without obstruction or gangrene: Secondary | ICD-10-CM | POA: Diagnosis not present

## 2022-05-24 DIAGNOSIS — I4510 Unspecified right bundle-branch block: Secondary | ICD-10-CM | POA: Diagnosis not present

## 2022-05-24 DIAGNOSIS — I491 Atrial premature depolarization: Secondary | ICD-10-CM | POA: Diagnosis not present

## 2022-06-12 DIAGNOSIS — I4510 Unspecified right bundle-branch block: Secondary | ICD-10-CM | POA: Diagnosis not present

## 2022-06-12 DIAGNOSIS — I491 Atrial premature depolarization: Secondary | ICD-10-CM | POA: Diagnosis not present

## 2022-06-13 ENCOUNTER — Ambulatory Visit: Payer: Medicare Other | Admitting: Internal Medicine

## 2022-06-13 ENCOUNTER — Encounter: Payer: Self-pay | Admitting: Internal Medicine

## 2022-06-13 VITALS — BP 121/65 | HR 79 | Ht 73.0 in | Wt 292.6 lb

## 2022-06-13 DIAGNOSIS — E119 Type 2 diabetes mellitus without complications: Secondary | ICD-10-CM

## 2022-06-13 DIAGNOSIS — I491 Atrial premature depolarization: Secondary | ICD-10-CM

## 2022-06-13 DIAGNOSIS — I10 Essential (primary) hypertension: Secondary | ICD-10-CM

## 2022-06-13 NOTE — Progress Notes (Signed)
Primary Physician/Referring:  Shirline Frees, MD  Patient ID: Noah Austin, male    DOB: [birth year], 67 y.o.   MRN: [id number]  Chief Complaint  Patient presents with   pac   Follow-up   Results   HPI:    ISAIR DEKKER  is a 67 y.o. male with past medical history significant for hypertension and hyperlipidemia who is here for a follow-up visit. His event monitor showed pretty frequent SVEs but patient does not have any symptoms on metoprolol. He denies palpitations and irregular heart rates today. His blood pressure is very well controlled on his current regiment. He does not have any side effects to the metoprolol. Patient feels well overall. He denies diaphoresis, syncope, edema, orthopnea, PND, claudication.  Past Medical History:  Diagnosis Date   Dyslipidemia    Hyperlipidemia    Hypertension    Obesity (BMI 35.0-39.9 without comorbidity)    Past Surgical History:  Procedure Laterality Date   HERNIA REPAIR  [id number]   umbilical hernia   left inguinal hernia     Repair about 27 years ago, about 81 yrs for 2nd surgery   SHOULDER OPEN ROTATOR CUFF REPAIR Right 05/14/2014   Procedure: RIGHT SHOULDER MINI OPEN ROTATOR CUFF REPAIR ;  Surgeon: Johnn Hai, MD;  Location: [facility];  Service: Orthopedics;  Laterality: Right;   UMBILICAL HERNIA REPAIR  07/17/2011   Procedure: HERNIA REPAIR UMBILICAL ADULT;  Surgeon: Adin Hector, MD;  Location: [facility];  Service: General;  Laterality: N/A;  repair strangulated umbilical hernia   Family History  Problem Relation Age of Onset   Cancer Maternal Grandmother        colon    Social History   Tobacco Use   Smoking status: Former    Years: 10.00    Types: Cigarettes    Quit date: 07/16/1988    Years since quitting: 33.9   Smokeless tobacco: Never  Substance Use Topics   Alcohol use: No   Marital Status: Married  ROS  Review of Systems  Cardiovascular:  Negative for irregular heartbeat and palpitations.  Neurological:   Negative for light-headedness.   Objective  Blood pressure 121/65, pulse 79, height '6\' 1"'$  (1.854 m), weight 292 lb 9.6 oz (132.7 kg), SpO2 93 %. Body mass index is 38.6 kg/m.     06/13/2022    9:39 AM 05/02/2022    9:35 AM 06/03/2020    6:30 AM  Vitals with BMI  Height '6\' 1"'$  '6\' 1"'$    Weight 292 lbs 10 oz 297 lbs   BMI [id number]   Systolic [id number] 0000000 [id number]  Diastolic 65 74 64  Pulse 79 100 67     Physical Exam Vitals reviewed.  HENT:     Head: Normocephalic and atraumatic.  Cardiovascular:     Rate and Rhythm: Regular rhythm. Tachycardia present.     Pulses: Normal pulses.     Heart sounds: Normal heart sounds. No murmur heard. Pulmonary:     Effort: Pulmonary effort is normal.     Breath sounds: Normal breath sounds.  Abdominal:     General: Bowel sounds are normal.  Musculoskeletal:     Right lower leg: No edema.     Left lower leg: No edema.  Skin:    General: Skin is warm and dry.  Neurological:     Mental Status: He is alert.     Medications and allergies   Allergies  Allergen Reactions   Codeine Itching  Amlodipine Besylate Other (See Comments)   Hydrochlorothiazide Other (See Comments)     Medication list after today's encounter   Current Outpatient Medications:    aspirin 81 MG tablet, Take 81 mg by mouth daily., Disp: , Rfl:    empagliflozin (JARDIANCE) 25 MG TABS tablet, Take 25 mg by mouth daily., Disp: , Rfl:    furosemide (LASIX) 20 MG tablet, , Disp: , Rfl:    lisinopril (ZESTRIL) 20 MG tablet, Take 20 mg by mouth daily., Disp: , Rfl:    metFORMIN (GLUCOPHAGE-XR) 500 MG 24 hr tablet, TAKE 1 TABLET BY MOUTH TWICE DAILY FOR 15 DAYS, Disp: , Rfl:    methocarbamol (ROBAXIN) 500 MG tablet, Take 1 tablet (500 mg total) by mouth every 6 (six) hours as needed for muscle spasms., Disp: 30 tablet, Rfl: 0   metoprolol succinate (TOPROL-XL) 50 MG 24 hr tablet, Take 1 tablet (50 mg total) by mouth at bedtime. Take with or immediately following a meal., Disp: 90  tablet, Rfl: 2   Misc Natural Products (OSTEO BI-FLEX ADV DOUBLE ST PO), Take by mouth., Disp: , Rfl:    Multiple Vitamins-Minerals (MULTIVITAMIN WITH MINERALS) tablet, Take 1 tablet by mouth daily., Disp: , Rfl:    Omega-3 Fatty Acids (OMEGA 3 PO), Take 2 capsules by mouth daily., Disp: , Rfl:    pantoprazole (PROTONIX) 20 MG tablet, TAKE 1 TABLET BY MOUTH ONCE DAILY 30 MINUTES BEFORE BREAKFAST (MUST MAKE APPOINTMENT FOR FUTURE REFILLS), Disp: , Rfl:    pravastatin (PRAVACHOL) 40 MG tablet, Take 40 mg by mouth at bedtime. , Disp: , Rfl:    tadalafil (CIALIS) 5 MG tablet, Take 1 tablet by mouth daily., Disp: , Rfl:    indapamide (LOZOL) 2.5 MG tablet, Take 1 tablet by mouth every morning., Disp: , Rfl:    zolpidem (AMBIEN) 10 MG tablet, Take 10 mg by mouth at bedtime., Disp: , Rfl:   Laboratory examination:   Lab Results  Component Value Date   NA 140 06/03/2020   K 3.5 06/03/2020   CO2 26 06/03/2020   GLUCOSE 124 (H) 06/03/2020   BUN 22 06/03/2020   CREATININE 0.94 06/03/2020   CALCIUM 9.0 06/03/2020   GFRNONAA >60 06/03/2020       Latest Ref Rng & Units 06/03/2020    4:05 AM 06/03/2020    1:14 AM 07/03/2018    8:34 PM  CMP  Glucose 70 - 99 mg/dL  124  118   BUN 8 - 23 mg/dL  22  24   Creatinine 0.61 - 1.24 mg/dL  0.94  0.83   Sodium 135 - 145 mmol/L  140  138   Potassium 3.5 - 5.1 mmol/L  3.5  3.3   Chloride 98 - 111 mmol/L  104  103   CO2 22 - 32 mmol/L  26  24   Calcium 8.9 - 10.3 mg/dL  9.0  8.9   Total Protein 6.5 - 8.1 g/dL 6.3   7.1   Total Bilirubin 0.3 - 1.2 mg/dL 1.0   1.1   Alkaline Phos 38 - 126 U/L 50   58   AST 15 - 41 U/L 27   34   ALT 0 - 44 U/L 29   42       Latest Ref Rng & Units 06/03/2020    1:14 AM 07/03/2018    8:34 PM 05/15/2014    5:35 AM  CBC  WBC 4.0 - 10.5 K/uL 5.9  7.1  13.4  Hemoglobin 13.0 - 17.0 g/dL 15.2  15.4  13.6   Hematocrit 39.0 - 52.0 % 44.3  43.5  38.9   Platelets 150 - 400 K/uL 205  222  194     Lipid Panel No results for  input(s): "CHOL", "TRIG", "[place]", "VLDL", "HDL", "CHOLHDL", "LDLDIRECT" in the last 8760 hours.  HEMOGLOBIN A1C No results found for: "HGBA1C", "MPG" TSH No results for input(s): "TSH" in the last 8760 hours.  External labs:     Radiology:    Cardiac Studies:   Echocardiogram 05/04/2022: Normal LV systolic function with visual EF 60-65%. Left ventricle cavity is normal in size. Mild concentric hypertrophy of the left ventricle. Normal global wall motion. Doppler evidence of grade I (impaired) diastolic dysfunction, normal LAP. Calculated EF 65%. Left atrial cavity is mild to moderately dilated. Structurally normal tricuspid valve with no regurgitation. No evidence of pulmonary hypertension. No prior available for comparison.    [facility]io Patch Extended Out Patient EKG monitoring 13 days starting 05/04/2022:   Dominant rhythm sinus rhythm. QRS morphology changes were present throughout recording. Bundle Branch Block/IVCD was present. HR 60-194 bpm. Avg HR 91 bpm.  9904 episodes of SVT, fastest at 194 bpm for 10 beats, longest for 2 min at 117 bpm. 0 episodes of VT Isolated SVE 16.7% [id number]), Couplet SVE <1.0%, Triplet SVE 1.4% Isolated VE 1.5%, Couplet VE <1.0%, Triplet VE <1.0% Ventricular Bigeminy and Trigeminy were present. No atrial fibrillation/atrial flutter/SVT/VT/high grade AV block, sinus pause >3sec noted.   EKG:   05/02/2022: Sinus Tachycardia with sinus arrhythmia. left axis with RBBB and LAFB.   Assessment     ICD-10-CM   1. PAC (premature atrial contraction)  I49.1     2. Essential hypertension  I10     3. Type 2 diabetes mellitus without complication, without long-term current use of insulin (HCC)  E11.9        No orders of the defined types were placed in this encounter.   No orders of the defined types were placed in this encounter.   There are no discontinued medications.    Recommendations:   DEKE RENCH is a 67 y.o.  male with  abnormal EKG  PAC (premature atrial contraction) Event monitor showed >16% SVE burden but patient is asymptomatic on Toprol Will continue on current regiment Echocardiogram discussed with patient   Essential hypertension Continue current cardiac medications. Encourage low-sodium diet, less than 2000 mg daily. BP very well controlled on this regiment   Diabetes On Jardiance and Metformin  Follow-up in 6 months or sooner if needed     Floydene Flock, DO, [facility]  06/13/2022, 10:04 AM Office: [phone] Pager: [phone]

## 2022-07-05 IMAGING — CR DG CHEST 2V
2 series · 2 of 2 positions shown · non-contrast
Comparison: 07/03/2018

CLINICAL DATA: Chest pain

EXAM:
CHEST - 2 VIEW

[chest pa]
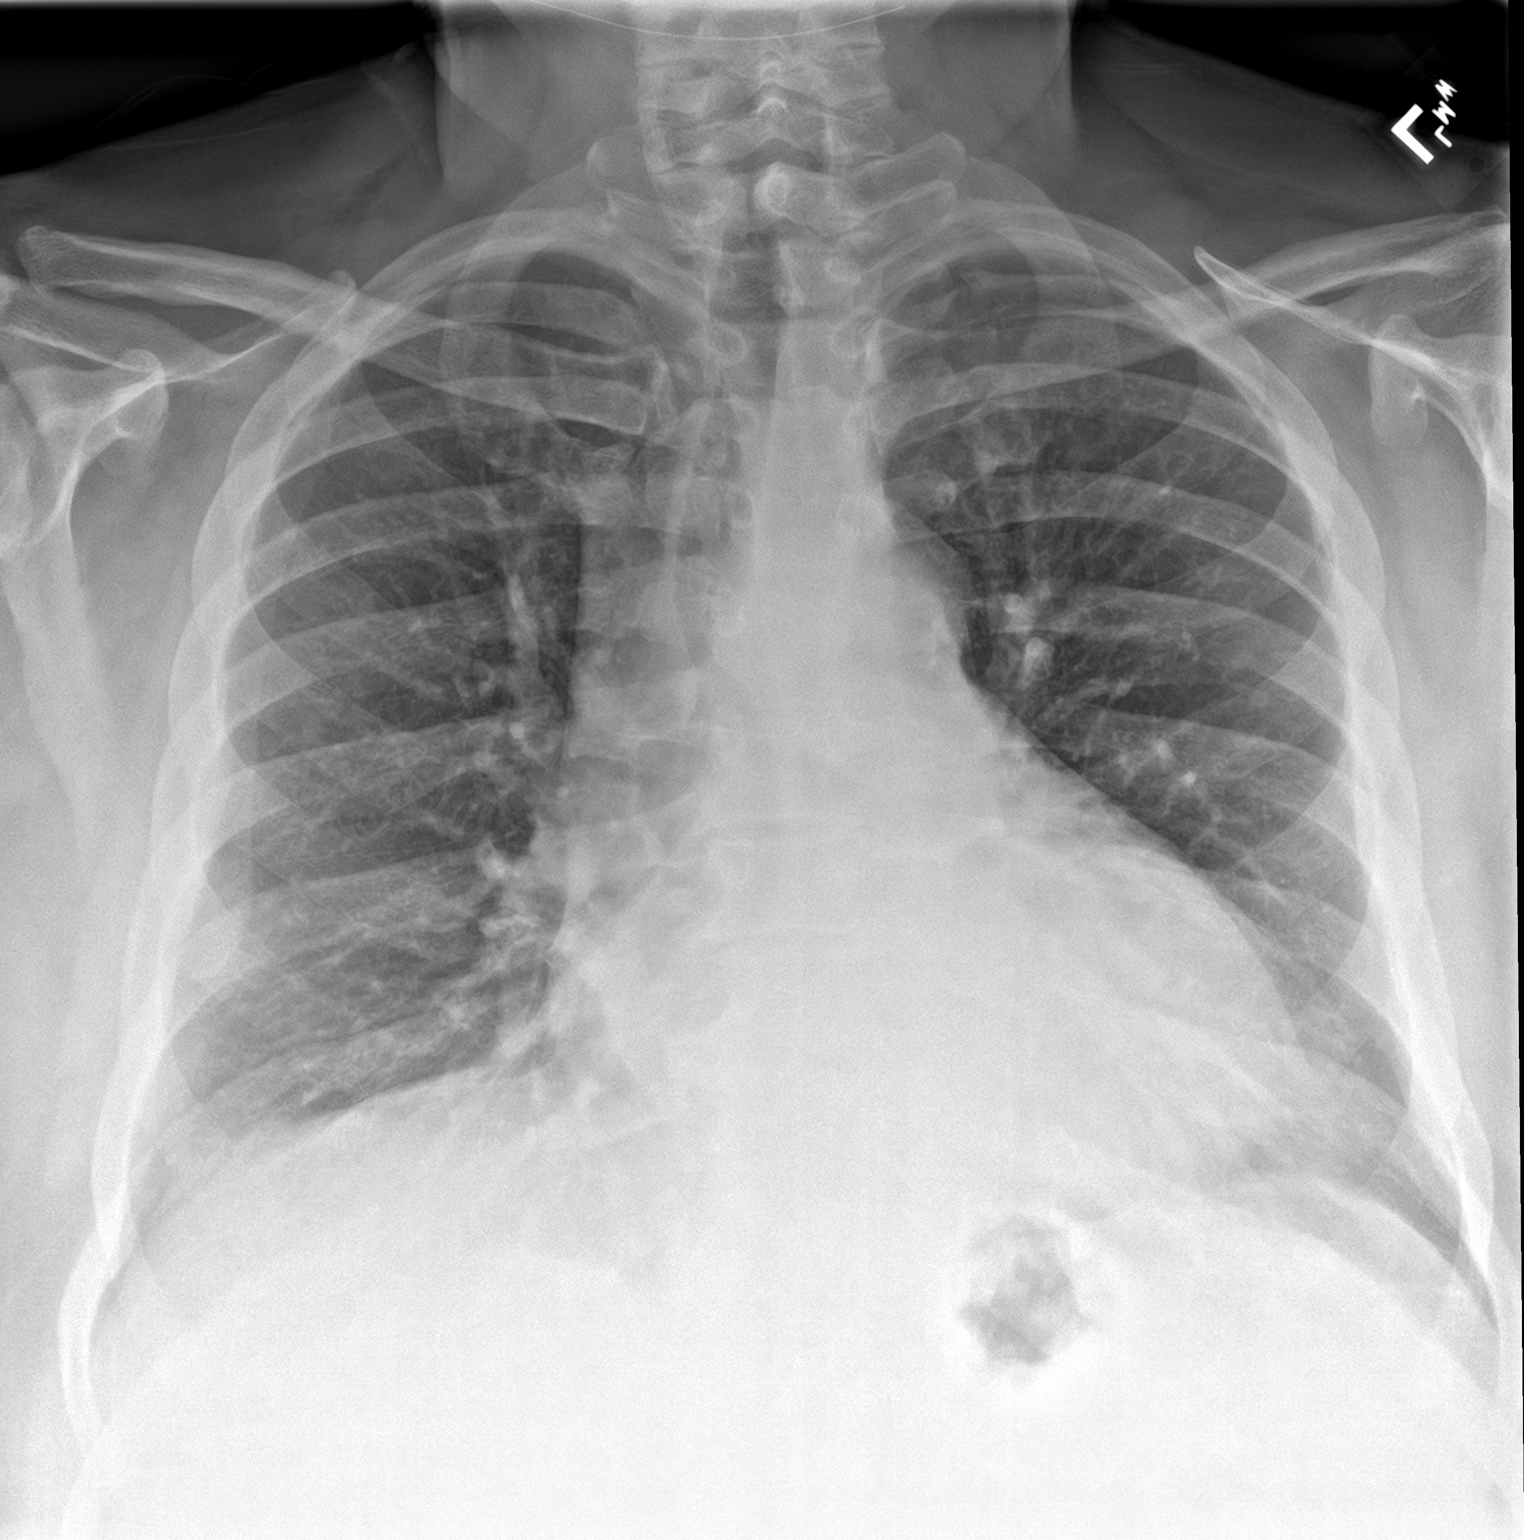

[chest lat]
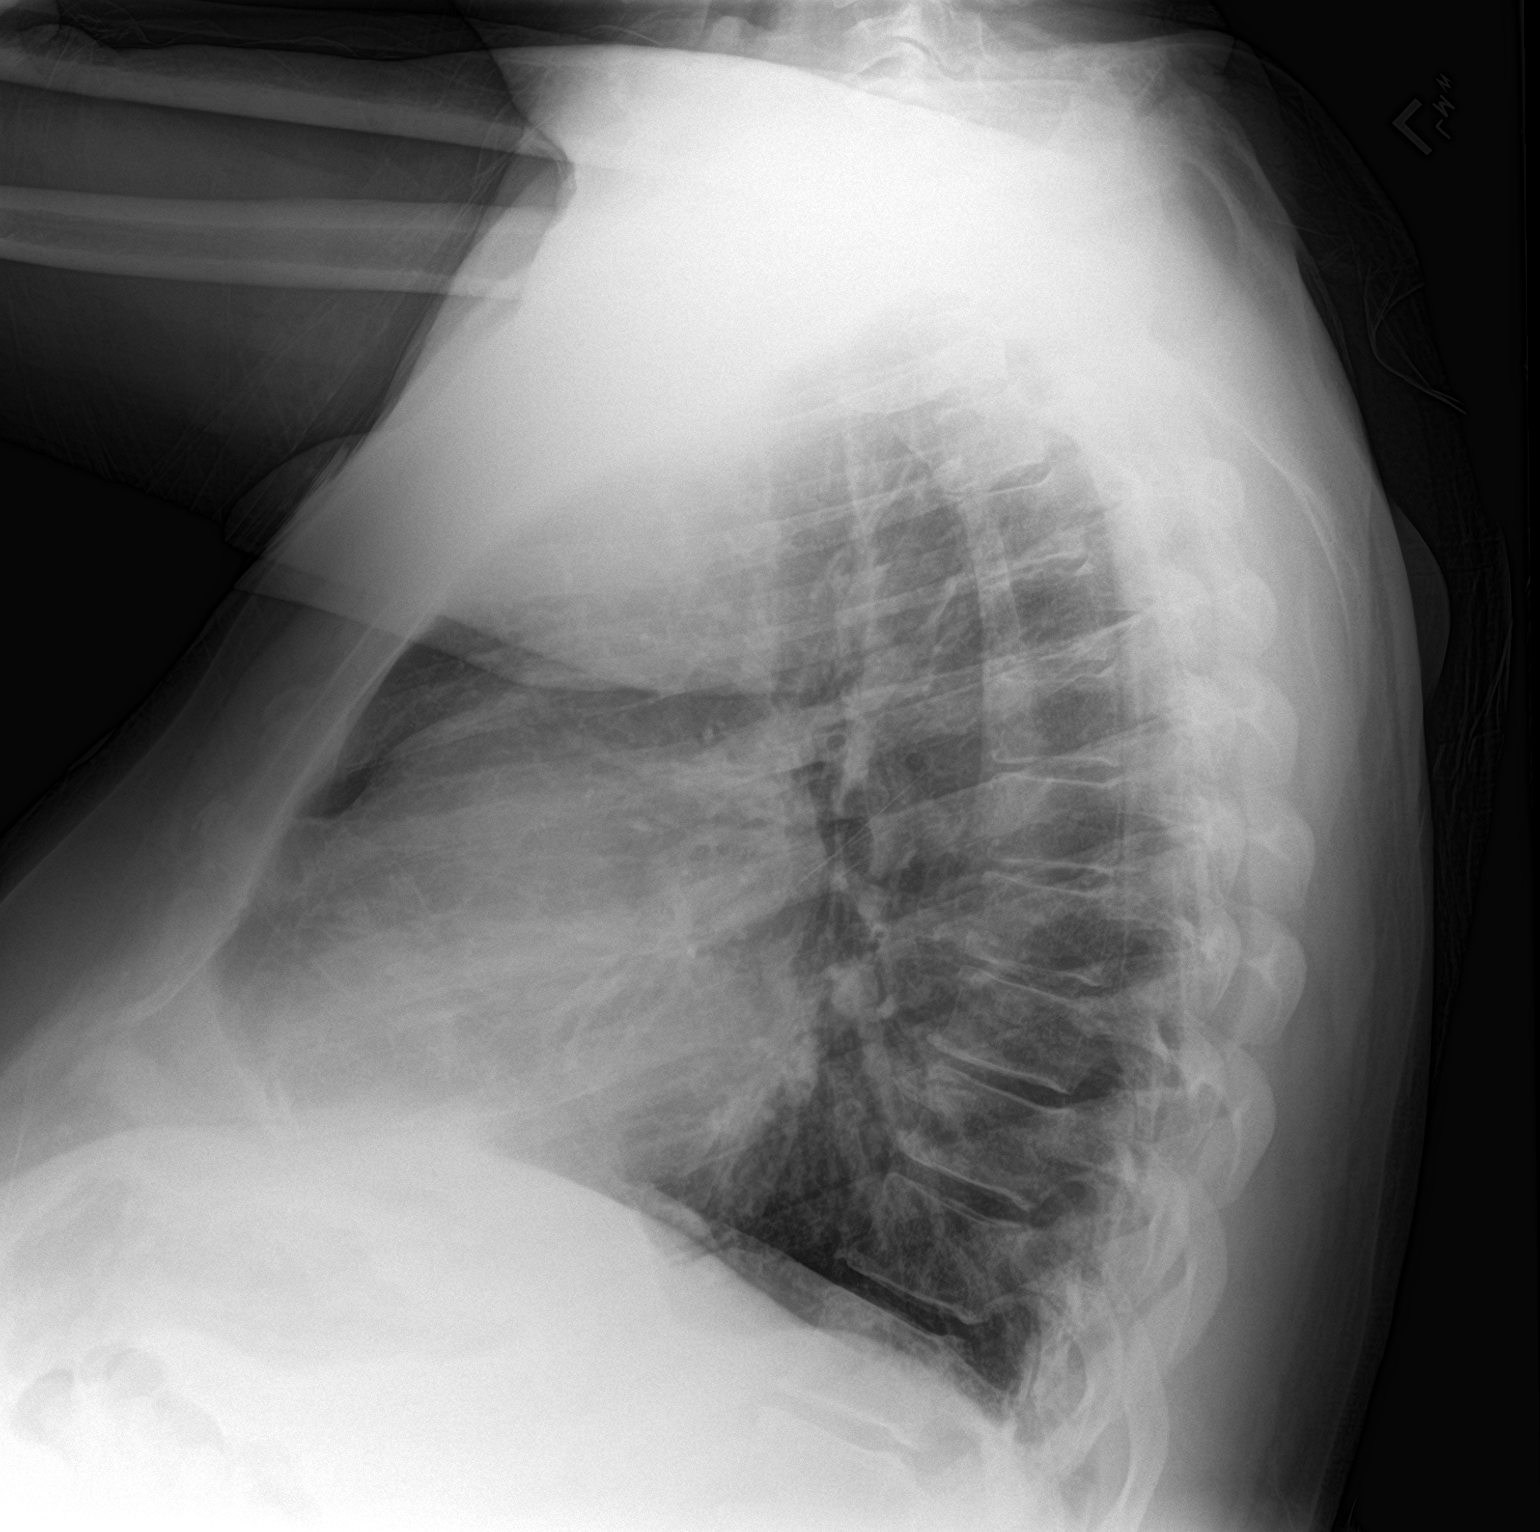

[2 of 2 positions shown; findings below may reference images not displayed]

FINDINGS: The heart size and mediastinal contours are within normal limits.
Both lungs are clear. The visualized skeletal structures are
unremarkable.
IMPRESSION: No active cardiopulmonary disease.

## 2022-07-05 IMAGING — CT CT ABD-PELV W/ CM
2 of 5 series · 14 of 46 positions shown, 16 images · IV contrast (APPLIED)
Comparison: Ultrasound abdomen 12/30/2014

CLINICAL DATA: reached down to grab something off of floor and felt
something pop and now has pain in right lower rib cage. pain worse
with movement and inspiration

EXAM:
CT ANGIOGRAPHY CHEST
CT ABDOMEN AND PELVIS WITH CONTRAST
TECHNIQUE: Multidetector CT imaging of the chest was performed using the
standard protocol during bolus administration of intravenous
contrast. Multiplanar CT image reconstructions and MIPs were
obtained to evaluate the vascular anatomy. Multidetector CT imaging
of the abdomen and pelvis was performed using the standard protocol
during bolus administration of intravenous contrast.
CONTRAST:  100mL OMNIPAQUE IOHEXOL 350 MG/ML SOLN

[Series 2: axial st · axial · 0.98mm/px · z∈[-562,-82]mm · 11 of 110 slices shown, 13 images]
[im 7/110  soft-tissue]
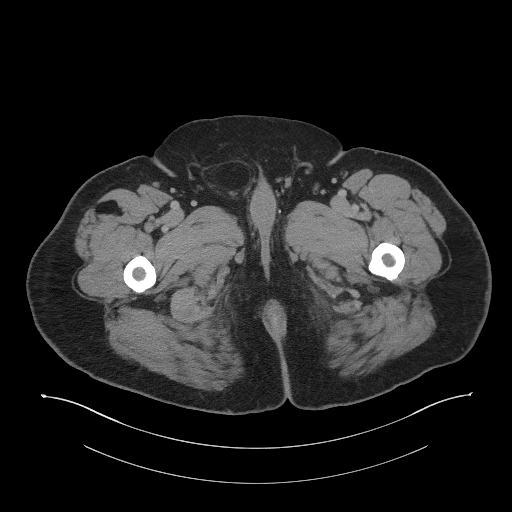
[im 7/110  bone]
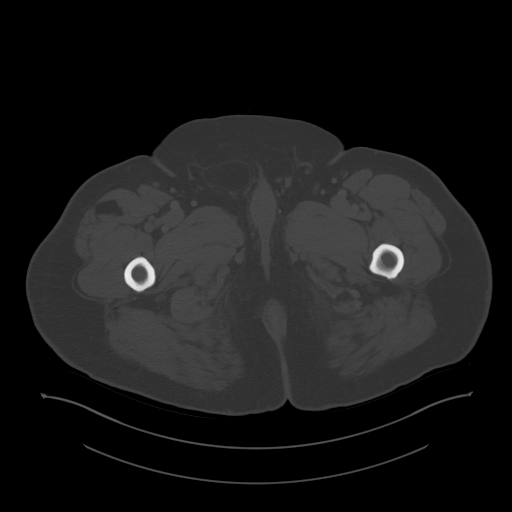
[im 20/110  soft-tissue]
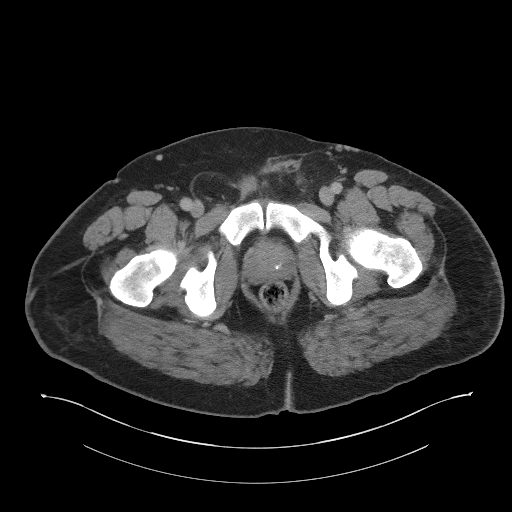
[im 26/110  soft-tissue]
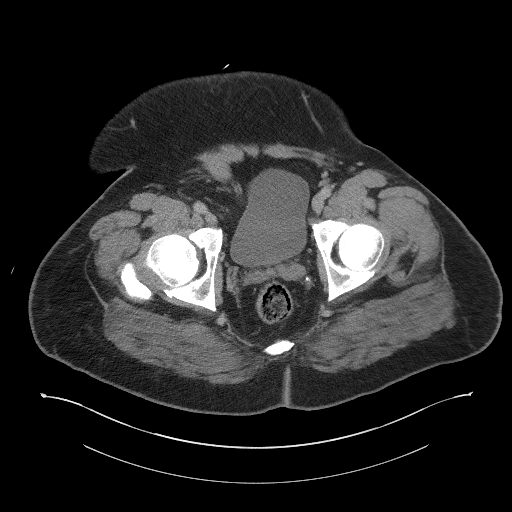
[im 39/110  soft-tissue]
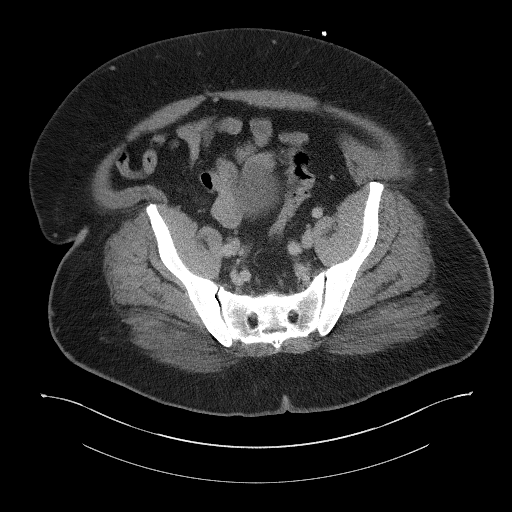
[im 45/110  soft-tissue]
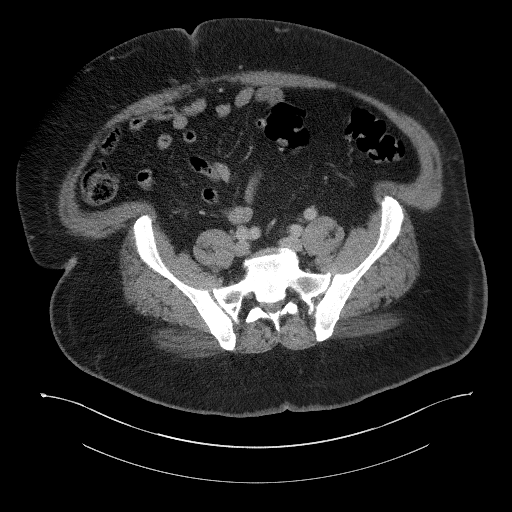
[im 58/110  soft-tissue]
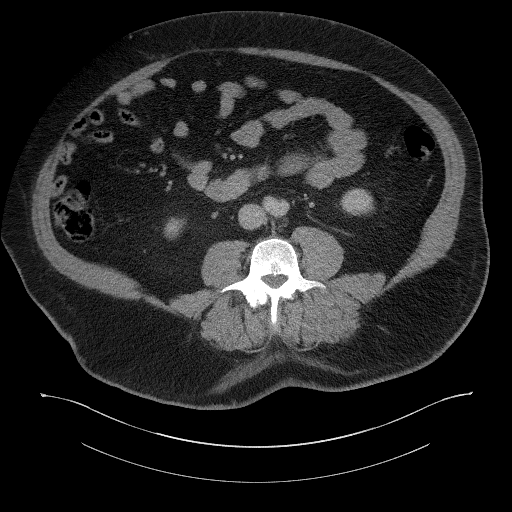
[im 65/110  soft-tissue]
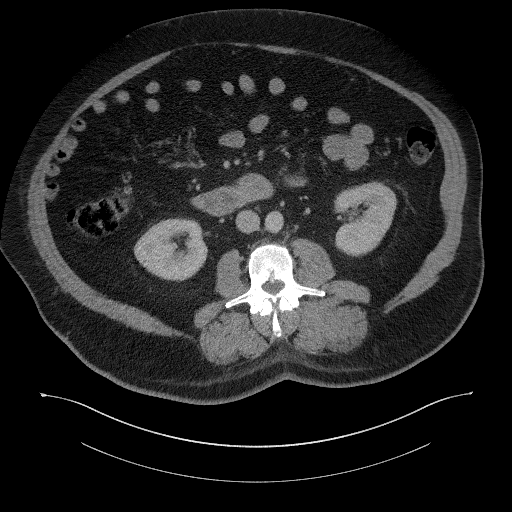
[im 71/110  soft-tissue]
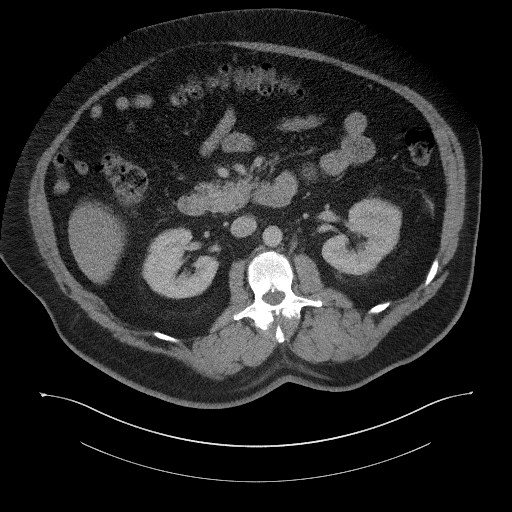
[im 84/110  soft-tissue]
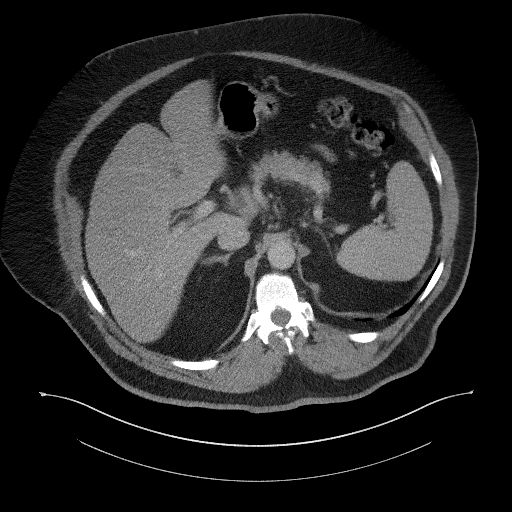
[im 84/110  bone]
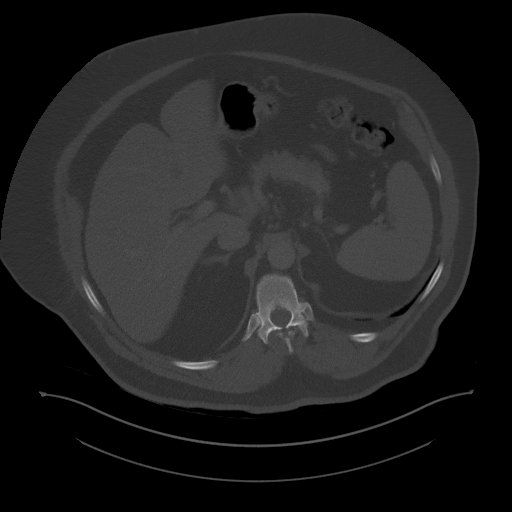
[im 90/110  soft-tissue]
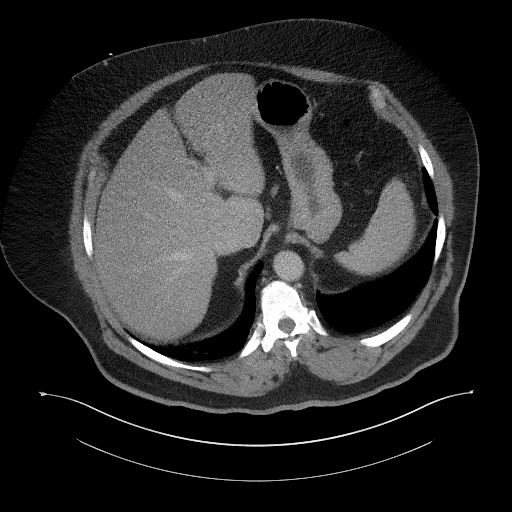
[im 103/110  soft-tissue]
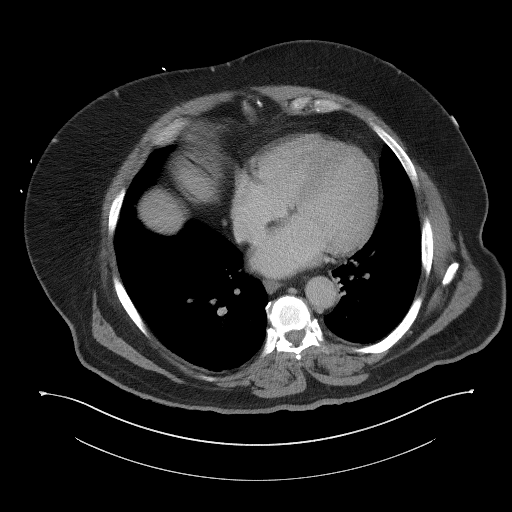

[Series 5: coronal st · coronal · 0.97mm/px · 3 of 134 slices shown]
[im 45/134  soft-tissue]
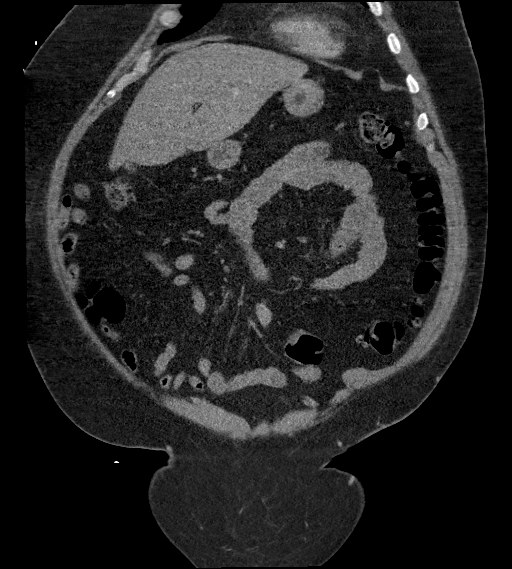
[im 60/134  soft-tissue]
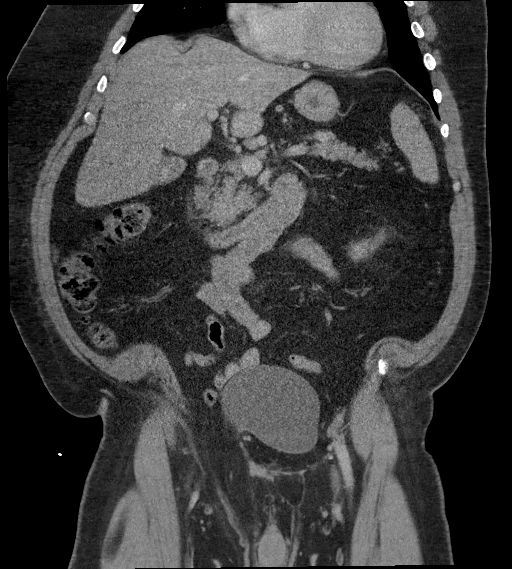
[im 74/134  soft-tissue]
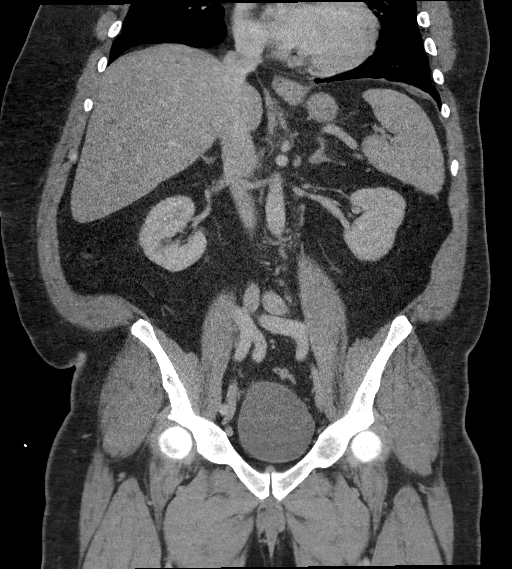

[14 of 46 positions shown; findings below may reference images not displayed]

FINDINGS: CTA CHEST FINDINGS

Cardiovascular: Satisfactory opacification of the pulmonary arteries
to the segmental level. No evidence of pulmonary embolism. Normal
heart size. No significant pericardial effusion. The thoracic aorta
is normal in caliber. No atherosclerotic plaque of the thoracic
aorta. No coronary artery calcifications.

Mediastinum/Nodes: No enlarged mediastinal, hilar, or axillary lymph
nodes. Thyroid gland, trachea, and esophagus demonstrate no
significant findings.

Lungs/Pleura: Right lower lobe linear atelectasis. Left base linear
atelectasis. Suggestion of a pulmonary micronodule within the left
lower lobe ([DATE], [DATE]). No pulmonary mass. No pleural effusion. No
pneumothorax.

Musculoskeletal:

Slightly heterogeneous, isodense to muscle, well-circumscribed,
x 3.9 cm superificial subcutaneus soft tissue lesion anterior to the
manubrium ([DATE]). Fluid density lesion within the superficial
subcutaneous soft tissues of the upper left/midline back measuring
3.4 x 2.1 cm ([DATE]).

No suspicious lytic or blastic osseous lesions. No acute displaced
fracture. Multilevel degenerative changes of the spine.

Review of the MIP images confirms the above findings.

CT ABDOMEN and PELVIS FINDINGS

Hepatobiliary: Couple of subcentimeter fluid density lesions within
the liver are noted ([DATE], [DATE]) these hepatic cysts likely
correlate with lesions noted on ultrasound abdomen. Couple of
subcentimeter hypodensities are too small to characterize. Calcified
gallstones are noted within the gallbladder lumen. No gallbladder
wall thickening or pericholecystic fluid. No biliary dilatation.

Pancreas: No focal lesion. Normal pancreatic contour. No surrounding
inflammatory changes. No main pancreatic ductal dilatation.

Spleen: Normal in size without focal abnormality.

Adrenals/Urinary Tract: No adrenal nodule bilaterally. Bilateral
kidneys enhance symmetrically. No hydronephrosis. No hydroureter.
The urinary bladder is unremarkable.

Stomach/Bowel: Stomach is within normal limits. No evidence of bowel
wall thickening or dilatation. Scattered colonic diverticulosis.
Appendix appears normal.

Vascular/Lymphatic: No abdominal aorta or iliac aneurysm. Tortuosity
of the iliac vessels is noted. Mild atherosclerotic plaque of the
aorta and its branches. No abdominal, pelvic, or inguinal
lymphadenopathy.

Reproductive: Prostate is unremarkable.

Other: No intraperitoneal free fluid. No intraperitoneal free gas.
No organized fluid collection.

Musculoskeletal:

Small to moderate volume fat containing right inguinal hernia with
small segment of urinary bladder protruding through the hernia.
Small left inguinal hernia with two separate abdominal wall defects
noted.

No suspicious lytic or blastic osseous lesions. No acute displaced
fracture. Multilevel degenerative changes of the spine.

Review of the MIP images confirms the above findings.
IMPRESSION: 1. No abnormality to explain etiology of patient's symptoms.
2. Heterogeneous 5.2 x 3.9 cm superificial subcutaneus soft tissue
lesion anterior to the manubrium. Differential diagnosis include
sebaceous cyst versus less likely but not excluded malignancy.
Correlate clinically with physical exam. Given heterogeneity,
recommend nonemergent ultrasound.
3. Fluid density lesion within the superficial subcutaneous soft
tissues of the upper left/midline back measuring 3.4 x 2.1 cm likely
represents a sebaceous cyst. Correlate clinically with physical
exam.
4. Cholelithiasis with acute cholecystitis.
5. Scattered colonic diverticulosis with no acute diverticulitis.
6. Small to moderate volume fat containing right inguinal hernia
with small segment of urinary bladder protruding through the hernia.
7. Small left inguinal hernia with two separate abdominal wall
defects noted.
8.  Aortic Atherosclerosis ([id number]).

## 2022-07-05 IMAGING — US US ABDOMEN LIMITED RUQ/ASCITES
1 series · 14 of 25 positions shown · non-contrast
Comparison: CT 06/03/2020.  Ultrasound 12/30/2014.

CLINICAL DATA: Right upper quadrant pain.

EXAM:
ULTRASOUND ABDOMEN LIMITED RIGHT UPPER QUADRANT

[Series 1: us abdomen limited ruq (liver/gb) · 14 of 45 slices shown]
[im 1/45]
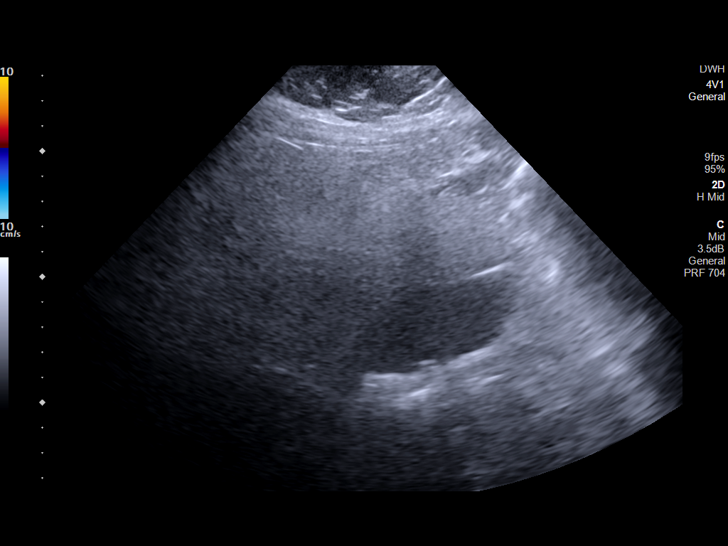
[im 4/45]
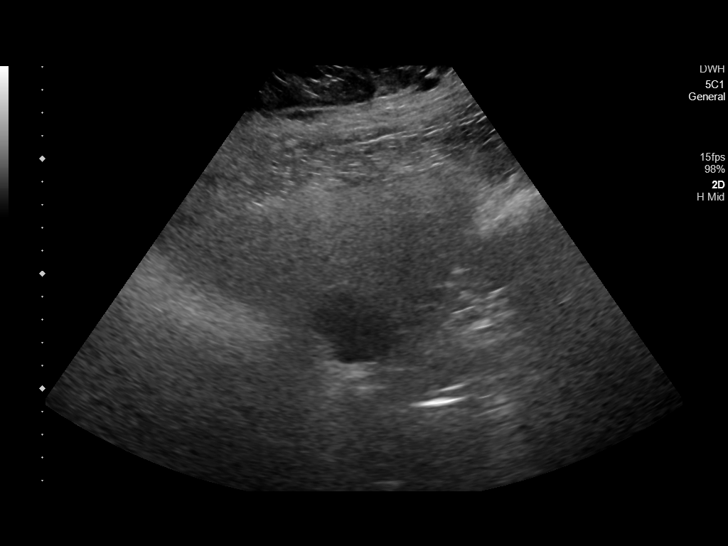
[im 8/45]
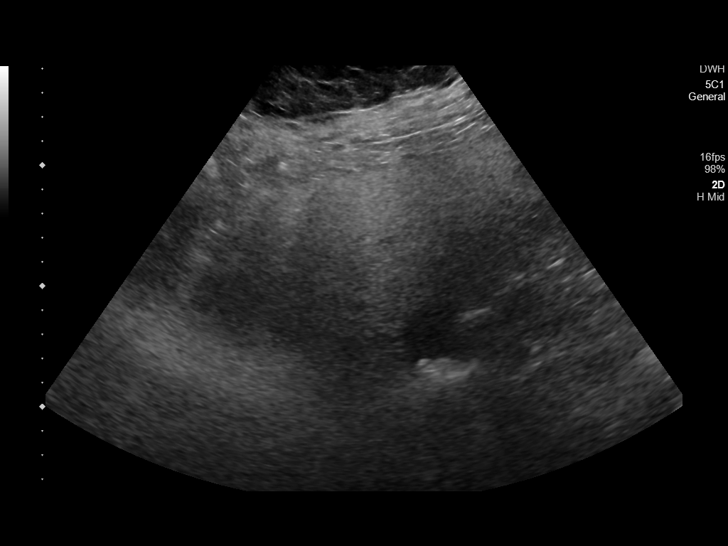
[im 12/45]
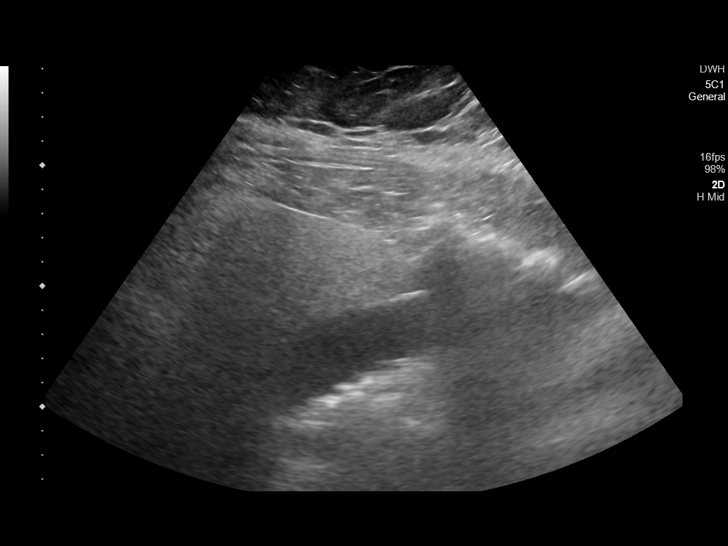
[im 15/45]
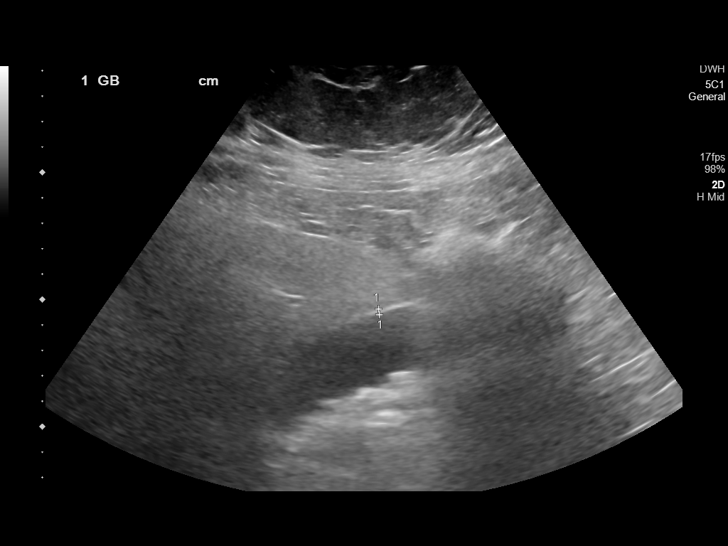
[im 17/45]
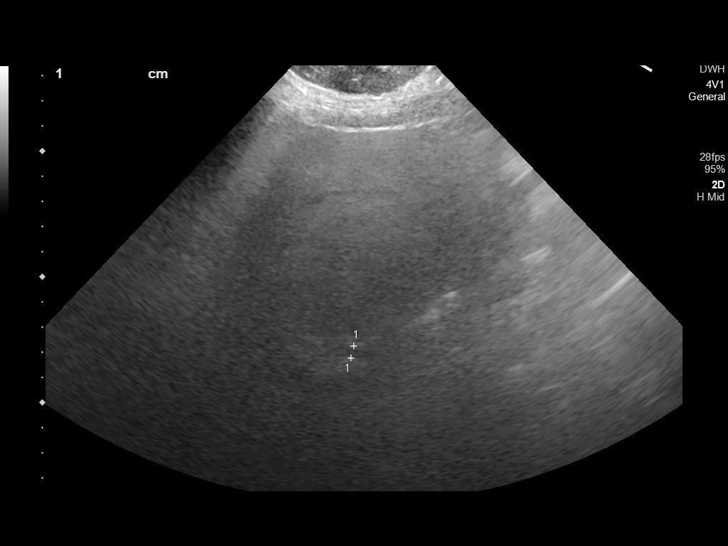
[im 21/45]
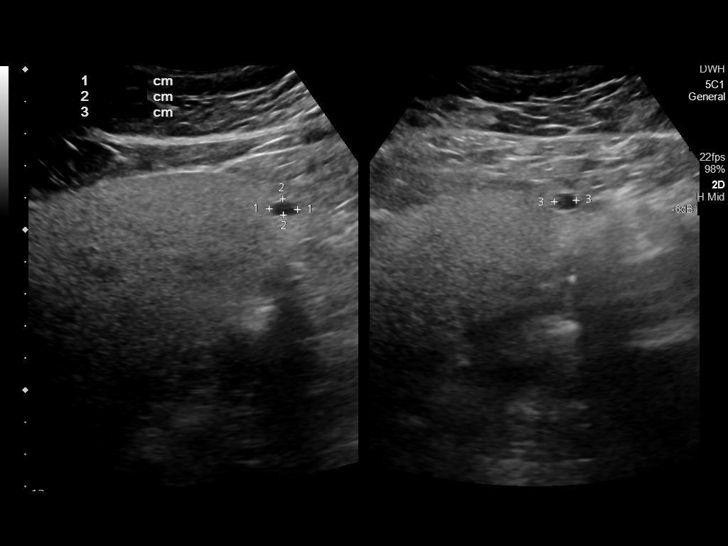
[im 24/45]
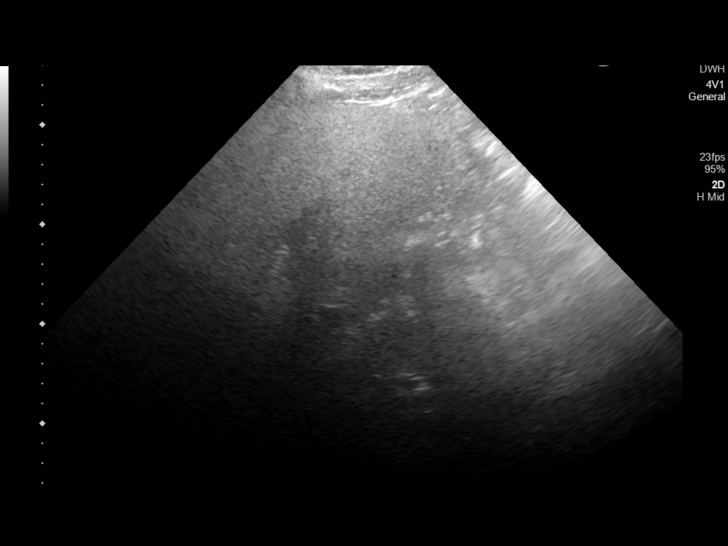
[im 28/45]
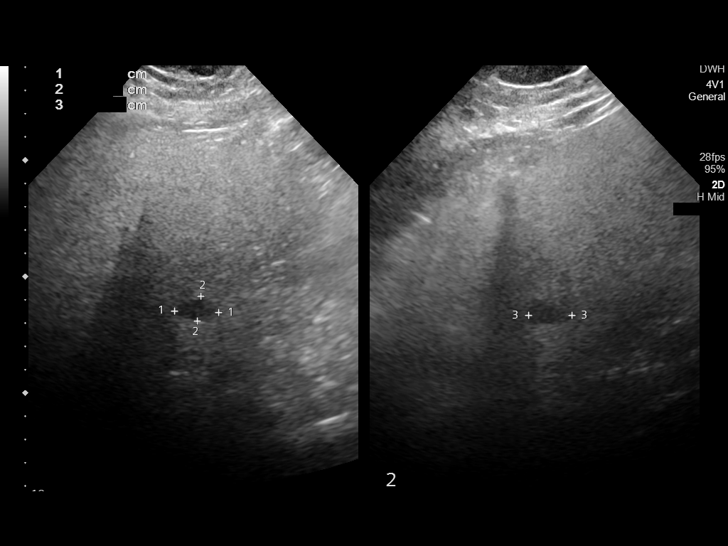
[im 30/45]
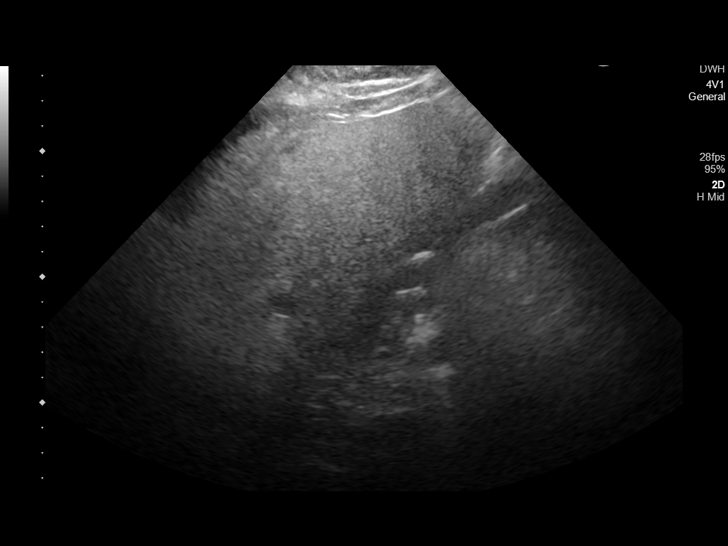
[im 34/45]
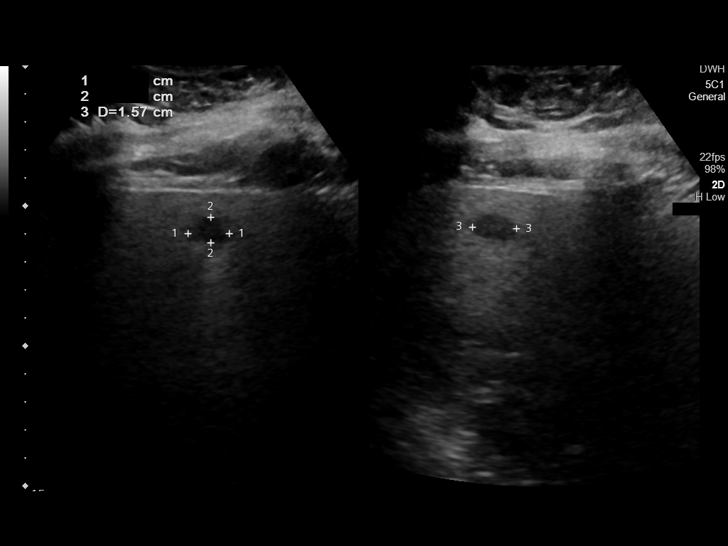
[im 37/45]
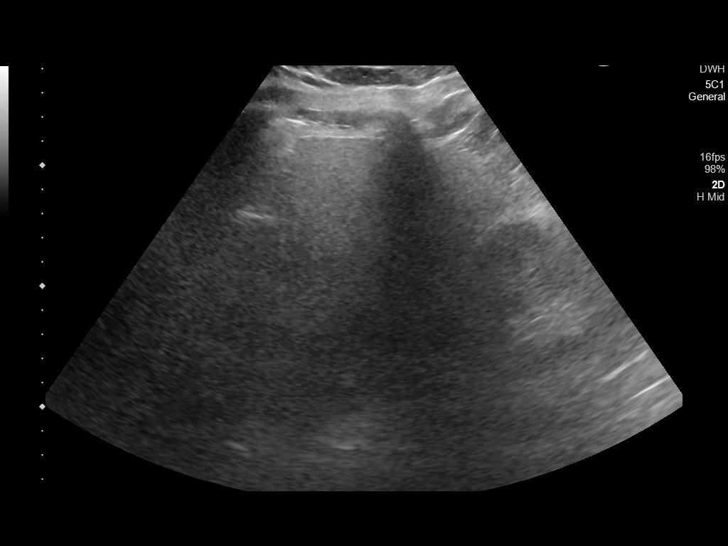
[im 41/45]
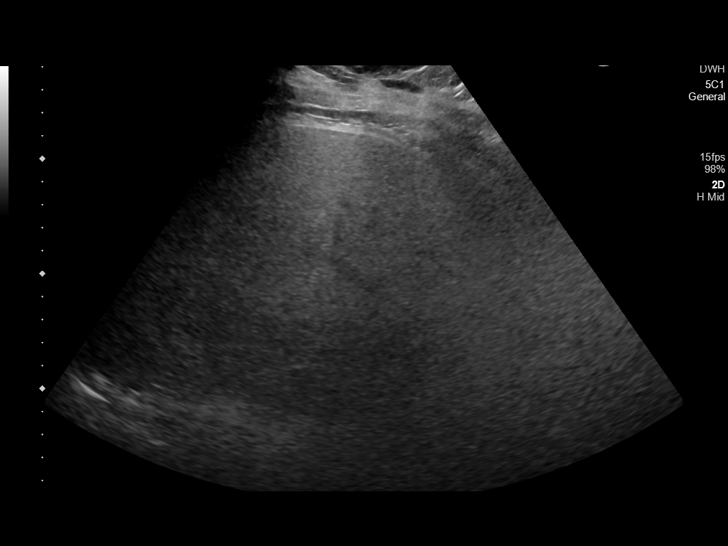
[im 45/45]
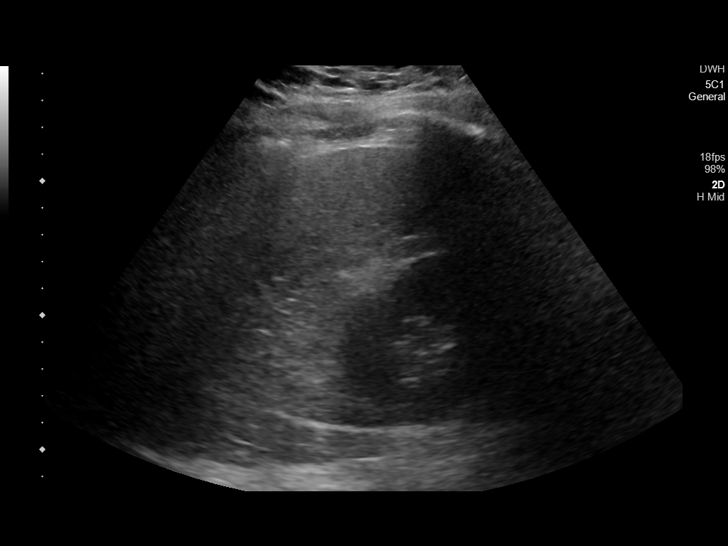

[14 of 25 positions shown; findings below may reference images not displayed]

FINDINGS: Gallbladder:

Gallstones measuring up to 9 mm. Gallbladder wall thickness 2.3 mm.
Negative Murphy sign.

Common bile duct:

Diameter: 4.9 mm

Liver:

Increase echogenicity of the liver again noted making evaluation
difficult. Increased echogenicity most likely related to fatty
infiltration and or hepatocellular disease. 3 small simple cyst
again noted. The largest measures 1.9 cm. Similar findings noted on
prior exams. Portal vein is patent on color Doppler imaging with
normal direction of blood flow towards the liver.

Other: Suboptimal exam due to patient's body habitus.
IMPRESSION: 1. Gallstones measuring up to 9 mm. No evidence of cholecystitis or
biliary distention.
2. Increase echogenicity of the liver consistent with fatty
infiltration or hepatocellular disease. Three small simple cyst in
the liver again noted. Similar findings noted on prior exams.

## 2022-07-07 DIAGNOSIS — E119 Type 2 diabetes mellitus without complications: Secondary | ICD-10-CM | POA: Diagnosis not present

## 2022-07-07 DIAGNOSIS — R609 Edema, unspecified: Secondary | ICD-10-CM | POA: Diagnosis not present

## 2022-07-07 DIAGNOSIS — E1169 Type 2 diabetes mellitus with other specified complication: Secondary | ICD-10-CM | POA: Diagnosis not present

## 2022-07-07 DIAGNOSIS — I700 Atherosclerosis of aorta: Secondary | ICD-10-CM | POA: Diagnosis not present

## 2022-07-07 DIAGNOSIS — M170 Bilateral primary osteoarthritis of knee: Secondary | ICD-10-CM | POA: Diagnosis not present

## 2022-07-07 DIAGNOSIS — G4700 Insomnia, unspecified: Secondary | ICD-10-CM | POA: Diagnosis not present

## 2022-07-07 DIAGNOSIS — G4733 Obstructive sleep apnea (adult) (pediatric): Secondary | ICD-10-CM | POA: Diagnosis not present

## 2022-07-26 ENCOUNTER — Other Ambulatory Visit: Payer: Self-pay | Admitting: Family Medicine

## 2022-07-26 ENCOUNTER — Ambulatory Visit
Admission: RE | Admit: 2022-07-26 | Discharge: 2022-07-26 | Disposition: A | Discharge status: Home / Self Care | Payer: Medicare Other | Source: Ambulatory Visit | Attending: Family Medicine | Admitting: Family Medicine

## 2022-07-26 DIAGNOSIS — R6884 Jaw pain: Secondary | ICD-10-CM | POA: Diagnosis not present

## 2022-07-26 DIAGNOSIS — E1169 Type 2 diabetes mellitus with other specified complication: Secondary | ICD-10-CM | POA: Diagnosis not present

## 2022-07-26 DIAGNOSIS — I10 Essential (primary) hypertension: Secondary | ICD-10-CM | POA: Diagnosis not present

## 2022-07-26 DIAGNOSIS — H9202 Otalgia, left ear: Secondary | ICD-10-CM | POA: Diagnosis not present

## 2022-08-14 DIAGNOSIS — E7800 Pure hypercholesterolemia, unspecified: Secondary | ICD-10-CM | POA: Diagnosis not present

## 2022-08-14 DIAGNOSIS — E1169 Type 2 diabetes mellitus with other specified complication: Secondary | ICD-10-CM | POA: Diagnosis not present

## 2022-08-14 DIAGNOSIS — I10 Essential (primary) hypertension: Secondary | ICD-10-CM | POA: Diagnosis not present

## 2022-09-05 ENCOUNTER — Telehealth: Payer: Self-pay

## 2022-09-05 DIAGNOSIS — E7800 Pure hypercholesterolemia, unspecified: Secondary | ICD-10-CM | POA: Diagnosis not present

## 2022-09-05 DIAGNOSIS — E1169 Type 2 diabetes mellitus with other specified complication: Secondary | ICD-10-CM | POA: Diagnosis not present

## 2022-09-05 DIAGNOSIS — I10 Essential (primary) hypertension: Secondary | ICD-10-CM | POA: Diagnosis not present

## 2022-09-05 NOTE — Telephone Encounter (Signed)
Patient PCP called [place] to inform us that patient pulse has been running high. The provider mention his bp was 127/95 pulse 118, 126/81 pulse of 126, and 132/70 pulse of 125. Patient PCP wants patient to stop the lisinopril and restart the Metoprolol 50 mg 2 tabs daily. Patient stated that he had stopped his Metoprolol 50 mg due that when he was taking the Metoprolol and lisinopril it was dropping his bp very low.   Patient PCP will call him to let him know to restart his metoprolol and to stop the lisinopril for now. Dr. Melton Alar agreed

## 2022-09-19 DIAGNOSIS — Z7982 Long term (current) use of aspirin: Secondary | ICD-10-CM | POA: Diagnosis not present

## 2022-09-19 DIAGNOSIS — K2289 Other specified disease of esophagus: Secondary | ICD-10-CM | POA: Diagnosis not present

## 2022-09-19 DIAGNOSIS — Z79899 Other long term (current) drug therapy: Secondary | ICD-10-CM | POA: Diagnosis not present

## 2022-09-19 DIAGNOSIS — Z7984 Long term (current) use of oral hypoglycemic drugs: Secondary | ICD-10-CM | POA: Diagnosis not present

## 2022-09-19 DIAGNOSIS — K22711 Barrett's esophagus with high grade dysplasia: Secondary | ICD-10-CM | POA: Diagnosis not present

## 2022-09-26 DIAGNOSIS — E1169 Type 2 diabetes mellitus with other specified complication: Secondary | ICD-10-CM | POA: Diagnosis not present

## 2022-09-26 DIAGNOSIS — I10 Essential (primary) hypertension: Secondary | ICD-10-CM | POA: Diagnosis not present

## 2022-09-26 DIAGNOSIS — E7800 Pure hypercholesterolemia, unspecified: Secondary | ICD-10-CM | POA: Diagnosis not present

## 2022-09-27 DIAGNOSIS — H25813 Combined forms of age-related cataract, bilateral: Secondary | ICD-10-CM | POA: Diagnosis not present

## 2022-09-27 DIAGNOSIS — H1045 Other chronic allergic conjunctivitis: Secondary | ICD-10-CM | POA: Diagnosis not present

## 2022-09-27 DIAGNOSIS — E119 Type 2 diabetes mellitus without complications: Secondary | ICD-10-CM | POA: Diagnosis not present

## 2022-09-27 DIAGNOSIS — H40012 Open angle with borderline findings, low risk, left eye: Secondary | ICD-10-CM | POA: Diagnosis not present

## 2022-09-27 DIAGNOSIS — H04123 Dry eye syndrome of bilateral lacrimal glands: Secondary | ICD-10-CM | POA: Diagnosis not present

## 2022-09-27 DIAGNOSIS — H43811 Vitreous degeneration, right eye: Secondary | ICD-10-CM | POA: Diagnosis not present

## 2022-10-17 ENCOUNTER — Other Ambulatory Visit: Payer: Self-pay | Admitting: Internal Medicine

## 2022-11-01 DIAGNOSIS — E1169 Type 2 diabetes mellitus with other specified complication: Secondary | ICD-10-CM | POA: Diagnosis not present

## 2022-11-08 ENCOUNTER — Emergency Department
Admission: EM | Admit: 2022-11-08 | Discharge: 2022-11-08 | Disposition: A | Discharge status: Home / Self Care | Payer: Medicare Other | Attending: Emergency Medicine | Admitting: Emergency Medicine

## 2022-11-08 ENCOUNTER — Other Ambulatory Visit: Payer: Self-pay

## 2022-11-08 ENCOUNTER — Emergency Department: Payer: Medicare Other

## 2022-11-08 DIAGNOSIS — M79605 Pain in left leg: Secondary | ICD-10-CM | POA: Insufficient documentation

## 2022-11-08 DIAGNOSIS — I10 Essential (primary) hypertension: Secondary | ICD-10-CM | POA: Diagnosis not present

## 2022-11-08 DIAGNOSIS — M792 Neuralgia and neuritis, unspecified: Secondary | ICD-10-CM | POA: Diagnosis not present

## 2022-11-08 MED ORDER — GABAPENTIN 100 MG PO CAPS
100.0000 mg | ORAL_CAPSULE | Freq: Once | ORAL | Status: AC
  Administered 2022-11-08: 100 mg via ORAL
  Filled 2022-11-08: qty 1

## 2022-11-08 MED ORDER — GABAPENTIN 100 MG PO CAPS: 100.0000 mg | ORAL_CAPSULE | Freq: Three times a day (TID) | ORAL | 0 refills | Status: DC

## 2022-11-08 NOTE — ED Provider Notes (Signed)
[facility] Provider Note    Event Date/Time   First MD Initiated Contact with Patient 11/08/22 [phone]     (approximate)  History   Chief Complaint: Leg Pain  HPI  Noah Austin is a 67 y.o. male with a past medical history of hypertension, hyperlipidemia, presents to the emergency department for left leg pain.  According to the patient for the past 4 days since driving home from [place] he has had pain shooting in the left lower leg.  Patient states when he attempts to walk on the leg he will have an electric shocklike pain going from the left knee down to the foot.  States it is not every time he walks it is intermittent but he wanted to make sure that he did not have a blood clot.  No chest pain no shortness of breath.  Physical Exam   Triage Vital Signs: ED Triage Vitals [11/08/22 1513]  Encounter Vitals Group     BP (!) 140/75     Systolic BP Percentile      Diastolic BP Percentile      Pulse Rate 68     Resp 17     Temp 98.1 F (36.7 C)     Temp Source Oral     SpO2 98 %     Weight 270 lb (122.5 kg)     Height 6\' 1"  (1.854 m)     Head Circumference      Peak Flow      Pain Score 6     Pain Loc      Pain Education      Exclude from Growth Chart     Most recent vital signs: Vitals:   11/08/22 1513  BP: (!) 140/75  Pulse: 68  Resp: 17  Temp: 98.1 F (36.7 C)  SpO2: 98%    General: Awake, no distress.  CV:  Good peripheral perfusion.  Regular rate and rhythm  Resp:  Normal effort.  Equal breath sounds bilaterally.  Abd:  No distention.  Soft, nontender.  No rebound or guarding. Other:  Good range of motion in the knee.  2+ DP pulse.  No erythema or warmth.  No calf tenderness.  No edema.   ED Results / Procedures / Treatments    RADIOLOGY  Ultrasound negative for DVT   MEDICATIONS ORDERED IN ED: Medications - No data to display   IMPRESSION / MDM / ASSESSMENT AND PLAN / ED COURSE  I reviewed the triage vital  signs and the nursing notes.  Patient's presentation is most consistent with acute presentation with potential threat to life or bodily function.  Patient presents emergency department for left leg pain for the last 4 days since driving home from [place].  Patient's physical exam is reassuring.  There is no swelling no erythema no calf tenderness of the left leg.  Good range of motion in the knee and the ankle without pain elicited.  2+ DP pulse.  Patient's description of the pain sounds more neuropathic where he states a sharp shocklike pain that goes from the back of the knee down the calf.  Given he had his knee bent for 7+ hours during the drive home from [place] could very likely be more neuropathic pain possible neuropraxia from positioning.  Ultrasound being negative for DVT I believe we could do a trial of gabapentin and have the patient follow-up with his doctor.  Patient agreeable to plan of care.  FINAL  CLINICAL IMPRESSION(S) / ED DIAGNOSES   Neuropathic pain  Rx / DC Orders   Gabapentin  Note:  This document was prepared using Dragon voice recognition software and may include unintentional dictation errors.   Minna Antis, MD 11/08/22 [phone]

## 2022-11-08 NOTE — ED Triage Notes (Addendum)
Pt sts that he has been having left leg pain for the last three days after a trip back from [place]. No swelling or injury noted

## 2022-11-16 DIAGNOSIS — M47816 Spondylosis without myelopathy or radiculopathy, lumbar region: Secondary | ICD-10-CM | POA: Diagnosis not present

## 2022-11-27 DIAGNOSIS — G4733 Obstructive sleep apnea (adult) (pediatric): Secondary | ICD-10-CM | POA: Diagnosis not present

## 2022-11-27 DIAGNOSIS — E1165 Type 2 diabetes mellitus with hyperglycemia: Secondary | ICD-10-CM | POA: Diagnosis not present

## 2022-11-27 DIAGNOSIS — Z9989 Dependence on other enabling machines and devices: Secondary | ICD-10-CM | POA: Diagnosis not present

## 2022-11-27 DIAGNOSIS — E7800 Pure hypercholesterolemia, unspecified: Secondary | ICD-10-CM | POA: Diagnosis not present

## 2022-11-27 DIAGNOSIS — R252 Cramp and spasm: Secondary | ICD-10-CM | POA: Diagnosis not present

## 2022-11-27 DIAGNOSIS — K219 Gastro-esophageal reflux disease without esophagitis: Secondary | ICD-10-CM | POA: Diagnosis not present

## 2022-11-27 DIAGNOSIS — R609 Edema, unspecified: Secondary | ICD-10-CM | POA: Diagnosis not present

## 2022-11-27 DIAGNOSIS — I700 Atherosclerosis of aorta: Secondary | ICD-10-CM | POA: Diagnosis not present

## 2022-11-27 DIAGNOSIS — I10 Essential (primary) hypertension: Secondary | ICD-10-CM | POA: Diagnosis not present

## 2022-12-12 ENCOUNTER — Ambulatory Visit: Payer: Medicare Other | Admitting: Cardiology

## 2022-12-12 DIAGNOSIS — M5459 Other low back pain: Secondary | ICD-10-CM | POA: Diagnosis not present

## 2022-12-19 ENCOUNTER — Ambulatory Visit: Payer: Medicare Other | Admitting: Cardiology

## 2022-12-19 ENCOUNTER — Encounter: Payer: Self-pay | Admitting: Cardiology

## 2022-12-19 VITALS — BP 116/72 | HR 83 | Resp 16 | Ht 73.0 in | Wt 272.0 lb

## 2022-12-19 DIAGNOSIS — I491 Atrial premature depolarization: Secondary | ICD-10-CM | POA: Diagnosis not present

## 2022-12-19 DIAGNOSIS — E119 Type 2 diabetes mellitus without complications: Secondary | ICD-10-CM | POA: Diagnosis not present

## 2022-12-19 DIAGNOSIS — I10 Essential (primary) hypertension: Secondary | ICD-10-CM

## 2022-12-19 DIAGNOSIS — I4510 Unspecified right bundle-branch block: Secondary | ICD-10-CM | POA: Diagnosis not present

## 2022-12-19 MED ORDER — METOPROLOL SUCCINATE ER 50 MG PO TB24
50.0000 mg | ORAL_TABLET | Freq: Every morning | ORAL | 0 refills | Status: DC
Start: 2022-12-19 — End: 2023-05-09

## 2022-12-19 NOTE — Progress Notes (Signed)
ID:  RYLEND Austin, DOB [birth year], MRN [id number]  PCP:  Noberto Retort, MD  Cardiologist:  Tessa Lerner, DO, [facility] (established care 12/19/22) Former Cardiology Providers: Dr. Clotilde Dieter  Date: 12/19/22 Last Office Visit: June 13, 2022  Chief Complaint  Patient presents with   [facility] (premature atrial contraction)   Follow-up    6 month    HPI  Noah Austin is a 67 y.o. Caucasian male whose  past medical history and cardiovascular risk factors include: Hypertension, hyperlipidemia, history of frequent SVE, NIDDM TypeII, obesity Body mass index is 35.89 kg/m.[name]   Patient is being followed in the practice given his elevated SVE burden.  Patient was being followed by my former partner Dr. Rozell Searing Custovic and I am seeing him for the first time at today's office visit.  Since last office visit in February 2024 patient denies anginal chest pain or heart failure symptoms.  Patient was referred to the practice for evaluation of palpitations and Zio patch noted SVE burden of 16.7%.  He was started on beta-blocker therapy and has done well with the current dose of beta-blockers.  CARDIAC DATABASE: EKG: December 19, 2022: Sinus rhythm, 81 bpm, frequent PACs, right bundle branch block.  When compared to prior tracing 05/02/2022 heart rate is lower.  Echocardiogram: PCV ECHOCARDIOGRAM COMPLETE 05/04/2022  - Narrative - Echocardiogram 05/04/2022: Normal LV systolic function with visual EF 60-65%. Left ventricle cavity is normal in size. Mild concentric hypertrophy of the left ventricle. Normal global wall motion. Doppler evidence of grade I (impaired) diastolic dysfunction, normal LAP. Calculated EF 65%. Left atrial cavity is mild to moderately dilated. Structurally normal tricuspid valve with no regurgitation. No evidence of pulmonary hypertension. No prior available for comparison.    Cardiac monitor: Zio Patch Extended Out Patient EKG monitoring 13 days starting  05/04/2022:   Dominant rhythm sinus rhythm. QRS morphology changes were present throughout recording. Bundle Branch Block/IVCD was present. HR 60-194 bpm. Avg HR 91 bpm.  9904 episodes of SVT, fastest at 194 bpm for 10 beats, longest for 2 min at 117 bpm. 0 episodes of VT Isolated SVE 16.7% ([id number]), Couplet SVE <1.0%, Triplet SVE 1.4% Isolated VE 1.5%, Couplet VE <1.0%, Triplet VE <1.0% Ventricular Bigeminy and Trigeminy were present. No atrial fibrillation/atrial flutter/SVT/VT/high grade AV block, sinus pause >3sec noted.    ALLERGIES: Allergies  Allergen Reactions   Codeine Itching   Amlodipine Besylate Other (See Comments)   Hydrochlorothiazide Other (See Comments)    MEDICATION LIST PRIOR TO VISIT: Current Meds  Medication Sig   aspirin 81 MG tablet Take 81 mg by mouth daily.   empagliflozin (JARDIANCE) 25 MG TABS tablet Take 25 mg by mouth daily.   furosemide (LASIX) 20 MG tablet    gabapentin (NEURONTIN) 100 MG capsule Take 1 capsule (100 mg total) by mouth 3 (three) times daily.   indapamide (LOZOL) 2.5 MG tablet Take 1 tablet by mouth every morning.   lisinopril (ZESTRIL) 20 MG tablet Take 20 mg by mouth daily.   metFORMIN (GLUCOPHAGE-XR) 500 MG 24 hr tablet TAKE 1 TABLET BY MOUTH TWICE DAILY FOR 15 DAYS   Misc Natural Products (OSTEO BI-FLEX ADV DOUBLE ST PO) Take by mouth.   Multiple Vitamins-Minerals (MULTIVITAMIN WITH MINERALS) tablet Take 1 tablet by mouth daily.   Omega-3 Fatty Acids (OMEGA 3 PO) Take 2 capsules by mouth daily.   pantoprazole (PROTONIX) 20 MG tablet TAKE 1 TABLET BY MOUTH ONCE DAILY 30 MINUTES BEFORE BREAKFAST (MUST MAKE APPOINTMENT FOR  FUTURE REFILLS)   pravastatin (PRAVACHOL) 40 MG tablet Take 40 mg by mouth at bedtime.    Semaglutide,0.25 or 0.5MG /DOS, (OZEMPIC, 0.25 OR 0.5 MG/DOSE,) 2 MG/3ML SOPN Inject into the skin.   tadalafil (CIALIS) 5 MG tablet Take 1 tablet by mouth daily.   zolpidem (AMBIEN) 10 MG tablet Take 10 mg by mouth at bedtime.    [DISCONTINUED] metoprolol succinate (TOPROL-XL) 50 MG 24 hr tablet TAKE 1 TABLET BY MOUTH AT  BEDTIME TAKE WITH OR IMMEDIATELY FOLLOWING A MEAL     PAST MEDICAL HISTORY: Past Medical History:  Diagnosis Date   Dyslipidemia    Hyperlipidemia    Hypertension    Obesity (BMI 35.0-39.9 without comorbidity)     PAST SURGICAL HISTORY: Past Surgical History:  Procedure Laterality Date   HERNIA REPAIR  07/17/11   umbilical hernia   left inguinal hernia     Repair about 27 years ago, about 25 yrs for 2nd surgery   SHOULDER OPEN ROTATOR CUFF REPAIR Right 05/14/2014   Procedure: RIGHT SHOULDER MINI OPEN ROTATOR CUFF REPAIR ;  Surgeon: Javier Docker, MD;  Location: [facility];  Service: Orthopedics;  Laterality: Right;   UMBILICAL HERNIA REPAIR  07/17/2011   Procedure: HERNIA REPAIR UMBILICAL ADULT;  Surgeon: Ernestene Mention, MD;  Location: [facility];  Service: General;  Laterality: N/A;  repair strangulated umbilical hernia    FAMILY HISTORY: The patient family history includes Cancer in his maternal grandmother; Heart disease in his brother.  SOCIAL HISTORY:  The patient  reports that he quit smoking about 34 years ago. His smoking use included cigarettes. He started smoking about 44 years ago. He has never used smokeless tobacco. He reports that he does not drink alcohol and does not use drugs.  REVIEW OF SYSTEMS: Review of Systems  Cardiovascular:  Negative for chest pain, claudication, dyspnea on exertion, irregular heartbeat, leg swelling, near-syncope, orthopnea, palpitations, paroxysmal nocturnal dyspnea and syncope.  Respiratory:  Negative for shortness of breath.   Hematologic/Lymphatic: Negative for bleeding problem.  Musculoskeletal:  Negative for muscle cramps and myalgias.  Neurological:  Negative for dizziness and light-headedness.    PHYSICAL EXAM:    12/19/2022   11:13 AM 11/08/2022    3:13 PM 06/13/2022    9:39 AM  Vitals with BMI  Height 6\' 1"  6\' 1"  6\' 1"   Weight 272 lbs  270 lbs 292 lbs 10 oz  BMI 35.89 35.63 38.61  Systolic 116 140 161  Diastolic 72 75 65  Pulse 83 68 79    Physical Exam  Constitutional: No distress.  Age appropriate, hemodynamically stable.   Neck: No JVD present.  Cardiovascular: Normal rate, regular rhythm, S1 normal and S2 normal. Exam reveals no gallop, no S3 and no S4.  No murmur heard. Pulmonary/Chest: Effort normal and breath sounds normal. No stridor. He has no wheezes. He has no rales.  Abdominal: Soft. Bowel sounds are normal. He exhibits no distension. There is no abdominal tenderness.  Musculoskeletal:        General: No edema.     Cervical back: Neck supple.  Neurological: He is alert and oriented to person, place, and time. He has intact cranial nerves (2-12).  Skin: Skin is warm and moist.   LABORATORY DATA:    Latest Ref Rng & Units 06/03/2020    1:14 AM 07/03/2018    8:34 PM 05/15/2014    5:35 AM  CBC  WBC 4.0 - 10.5 K/uL 5.9  7.1  13.4   Hemoglobin  13.0 - 17.0 g/dL 78.2  95.6  21.3   Hematocrit 39.0 - 52.0 % 44.3  43.5  38.9   Platelets 150 - 400 K/uL 205  222  194        Latest Ref Rng & Units 06/03/2020    4:05 AM 06/03/2020    1:14 AM 07/03/2018    8:34 PM  CMP  Glucose 70 - 99 mg/dL  086  578   BUN 8 - 23 mg/dL  22  24   Creatinine 4.69 - 1.24 mg/dL  6.29  5.28   Sodium 413 - 145 mmol/L  140  138   Potassium 3.5 - 5.1 mmol/L  3.5  3.3   Chloride 98 - 111 mmol/L  104  103   CO2 22 - 32 mmol/L  26  24   Calcium 8.9 - 10.3 mg/dL  9.0  8.9   Total Protein 6.5 - 8.1 g/dL 6.3   7.1   Total Bilirubin 0.3 - 1.2 mg/dL 1.0   1.1   Alkaline Phos 38 - 126 U/L 50   58   AST 15 - 41 U/L 27   34   ALT 0 - 44 U/L 29   42     No results found for: "CHOL", "HDL", "LDLCALC", "LDLDIRECT", "TRIG", "CHOLHDL" No components found for: "NTPROBNP" No results for input(s): "PROBNP" in the last 8760 hours. No results for input(s): "TSH" in the last 8760 hours.  BMP No results for input(s): "NA", "K", "CL", "CO2",  "GLUCOSE", "BUN", "CREATININE", "CALCIUM", "GFRNONAA", "GFRAA" in the last 8760 hours.  HEMOGLOBIN A1C No results found for: "HGBA1C", "MPG"  IMPRESSION:    ICD-10-CM   1. PAC (premature atrial contraction)  I49.1 EKG 12-Lead    metoprolol succinate (TOPROL-XL) 50 MG 24 hr tablet    LONG TERM MONITOR (3-14 DAYS)    2. Essential hypertension  I10     3. Type 2 diabetes mellitus without complication, without long-term current use of insulin (HCC)  E11.9     4. RBBB (right bundle branch block)  I45.10        RECOMMENDATIONS: DESTINED GARIEPY is a 67 y.o. Caucasian male whose past medical history and cardiac risk factors include: Hypertension, hyperlipidemia, history of frequent SVE, NIDDM TypeII, obesity Body mass index is 35.89 kg/m.[name]   PAC (premature atrial contraction) EKG still notes frequent PACs. Currently on Toprol-XL 50 mg p.o. daily. Discussed up titration of Toprol-XL and transitioning him to diltiazem.  However he feels uncomfortable changing medications as he is going on to upcoming trips.  Will discuss this further at the next office visit.  Essential hypertension Office blood pressures are well-controlled. Continue current medical therapy. No changes warranted at this time  RBBB (right bundle branch block) Normal variant. No workup warranted at this time.  Medication reconciliation was reviewed but may not be accurate as he is not aware of exactly what he takes and how frequently.  Patient states that he just filled his pillbox when empty.  FINAL MEDICATION LIST END OF ENCOUNTER: Meds ordered this encounter  Medications   metoprolol succinate (TOPROL-XL) 50 MG 24 hr tablet    Sig: Take 1 tablet (50 mg total) by mouth every morning. Take with or immediately following a meal.    Dispense:  90 tablet    Refill:  0    Please send a replace/new response with 100-Day Supply if appropriate to maximize member benefit. Requesting 1 year supply.    Medications  Discontinued  During This Encounter  Medication Reason   metoprolol succinate (TOPROL-XL) 50 MG 24 hr tablet Change in therapy     Current Outpatient Medications:    aspirin 81 MG tablet, Take 81 mg by mouth daily., Disp: , Rfl:    empagliflozin (JARDIANCE) 25 MG TABS tablet, Take 25 mg by mouth daily., Disp: , Rfl:    furosemide (LASIX) 20 MG tablet, , Disp: , Rfl:    gabapentin (NEURONTIN) 100 MG capsule, Take 1 capsule (100 mg total) by mouth 3 (three) times daily., Disp: 90 capsule, Rfl: 0   indapamide (LOZOL) 2.5 MG tablet, Take 1 tablet by mouth every morning., Disp: , Rfl:    lisinopril (ZESTRIL) 20 MG tablet, Take 20 mg by mouth daily., Disp: , Rfl:    metFORMIN (GLUCOPHAGE-XR) 500 MG 24 hr tablet, TAKE 1 TABLET BY MOUTH TWICE DAILY FOR 15 DAYS, Disp: , Rfl:    Misc Natural Products (OSTEO BI-FLEX ADV DOUBLE ST PO), Take by mouth., Disp: , Rfl:    Multiple Vitamins-Minerals (MULTIVITAMIN WITH MINERALS) tablet, Take 1 tablet by mouth daily., Disp: , Rfl:    Omega-3 Fatty Acids (OMEGA 3 PO), Take 2 capsules by mouth daily., Disp: , Rfl:    pantoprazole (PROTONIX) 20 MG tablet, TAKE 1 TABLET BY MOUTH ONCE DAILY 30 MINUTES BEFORE BREAKFAST (MUST MAKE APPOINTMENT FOR FUTURE REFILLS), Disp: , Rfl:    pravastatin (PRAVACHOL) 40 MG tablet, Take 40 mg by mouth at bedtime. , Disp: , Rfl:    Semaglutide,0.25 or 0.5MG /DOS, (OZEMPIC, 0.25 OR 0.5 MG/DOSE,) 2 MG/3ML SOPN, Inject into the skin., Disp: , Rfl:    tadalafil (CIALIS) 5 MG tablet, Take 1 tablet by mouth daily., Disp: , Rfl:    zolpidem (AMBIEN) 10 MG tablet, Take 10 mg by mouth at bedtime., Disp: , Rfl:    methocarbamol (ROBAXIN) 500 MG tablet, Take 1 tablet (500 mg total) by mouth every 6 (six) hours as needed for muscle spasms. (Patient not taking: Reported on 12/19/2022), Disp: 30 tablet, Rfl: 0   metoprolol succinate (TOPROL-XL) 50 MG 24 hr tablet, Take 1 tablet (50 mg total) by mouth every morning. Take with or immediately following a  meal., Disp: 90 tablet, Rfl: 0  Orders Placed This Encounter  Procedures   LONG TERM MONITOR (3-14 DAYS)   EKG 12-Lead    There are no Patient Instructions on file for this visit.   --Continue cardiac medications as reconciled in final medication list. --Return in about 6 months (around 06/18/2023) for Follow up PACs. or sooner if needed. --Continue follow-up with your primary care physician regarding the management of your other chronic comorbid conditions.  Patient's questions and concerns were addressed to his satisfaction. He voices understanding of the instructions provided during this encounter.   This note was created using a voice recognition software as a result there may be grammatical errors inadvertently enclosed that do not reflect the nature of this encounter. Every attempt is made to correct such errors.  Tessa Lerner, [place], [facility]  Pager:  [phone] Office: [phone]

## 2022-12-26 DIAGNOSIS — M5416 Radiculopathy, lumbar region: Secondary | ICD-10-CM | POA: Diagnosis not present

## 2023-01-22 ENCOUNTER — Other Ambulatory Visit: Payer: Self-pay

## 2023-01-22 ENCOUNTER — Other Ambulatory Visit (INDEPENDENT_AMBULATORY_CARE_PROVIDER_SITE_OTHER): Payer: Medicare Other

## 2023-01-22 DIAGNOSIS — E1165 Type 2 diabetes mellitus with hyperglycemia: Secondary | ICD-10-CM | POA: Diagnosis not present

## 2023-01-22 DIAGNOSIS — Z794 Long term (current) use of insulin: Secondary | ICD-10-CM

## 2023-01-22 LAB — HEMOGLOBIN A1C: Hgb A1c MFr Bld: 7.8 % — ABNORMAL HIGH (ref 4.6–6.5)

## 2023-01-22 LAB — COMPREHENSIVE METABOLIC PANEL
ALT: 28 U/L (ref 0–53)
AST: 24 U/L (ref 0–37)
Albumin: 4 g/dL (ref 3.5–5.2)
Alkaline Phosphatase: 55 U/L (ref 39–117)
BUN: 19 mg/dL (ref 6–23)
CO2: 28 meq/L (ref 19–32)
Calcium: 9.4 mg/dL (ref 8.4–10.5)
Chloride: 95 meq/L — ABNORMAL LOW (ref 96–112)
Creatinine, Ser: 0.83 mg/dL (ref 0.40–1.50)
GFR: 89.55 mL/min (ref >60.00)
Glucose, Bld: 185 mg/dL — ABNORMAL HIGH (ref 70–99)
Potassium: 3 meq/L — ABNORMAL LOW (ref 3.5–5.1)
Sodium: 135 meq/L (ref 135–145)
Total Bilirubin: 1.4 mg/dL — ABNORMAL HIGH (ref 0.2–1.2)
Total Protein: 6.5 g/dL (ref 6.0–8.3)

## 2023-01-22 LAB — LIPID PANEL
Cholesterol: 159 mg/dL (ref 0–200)
HDL: 44.7 mg/dL (ref >39.00)
LDL Cholesterol: 35 mg/dL (ref 0–99)
NonHDL: 113.85
Total CHOL/HDL Ratio: 4
Triglycerides: 393 mg/dL — ABNORMAL HIGH (ref 0.0–149.0)
VLDL: 78.6 mg/dL — ABNORMAL HIGH (ref 0.0–40.0)

## 2023-01-22 LAB — MICROALBUMIN / CREATININE URINE RATIO
Creatinine,U: 40.8 mg/dL
Microalb Creat Ratio: 1.7 mg/g (ref 0.0–30.0)
Microalb, Ur: <0.7 mg/dL (ref 0.0–1.9)

## 2023-01-23 ENCOUNTER — Ambulatory Visit: Payer: Medicare Other | Admitting: "Endocrinology

## 2023-01-23 ENCOUNTER — Encounter: Payer: Self-pay | Admitting: "Endocrinology

## 2023-01-23 VITALS — BP 120/70 | HR 87 | Resp 20 | Ht 73.0 in | Wt 272.8 lb

## 2023-01-23 DIAGNOSIS — Z7984 Long term (current) use of oral hypoglycemic drugs: Secondary | ICD-10-CM

## 2023-01-23 DIAGNOSIS — E1165 Type 2 diabetes mellitus with hyperglycemia: Secondary | ICD-10-CM | POA: Diagnosis not present

## 2023-01-23 DIAGNOSIS — E782 Mixed hyperlipidemia: Secondary | ICD-10-CM

## 2023-01-23 DIAGNOSIS — Z7985 Long-term (current) use of injectable non-insulin antidiabetic drugs: Secondary | ICD-10-CM | POA: Diagnosis not present

## 2023-01-23 NOTE — Progress Notes (Signed)
Outpatient Endocrinology Note [name], MD  01/23/23   Noah Austin 1955-11-08 [id number]  Referring Provider: Tally Joe, MD Primary Care Provider: Noberto Retort, MD Reason for consultation: Subjective   Assessment & Plan  Diagnoses and all orders for this visit:  Uncontrolled type 2 diabetes mellitus with hyperglycemia (HCC)  Long term (current) use of oral hypoglycemic drugs  Long-term (current) use of injectable non-insulin antidiabetic drugs  Mixed hypercholesterolemia and hypertriglyceridemia    Diabetes Type II complicated by hyperglycemia,  Lab Results  Component Value Date   GFR 89.55 01/22/2023   Hba1c goal less than 7, current Hba1c is  Lab Results  Component Value Date   HGBA1C 7.8 (H) 01/22/2023   Will recommend the following: Metformin XR 500 mg bid Jardiance 25 mg daily Ozempic 1 mg weekly  No known contraindications/side effects to any of above medications  -Last LD and Tg are as follows: Lab Results  Component Value Date   LDLCALC 35 01/22/2023    Lab Results  Component Value Date   TRIG 393.0 (H) 01/22/2023   -On pravastatin 40 mg QD -Follow low fat diet and exercise   -Blood pressure goal <140/90 - Microalbumin/creatinine goal is < 30 -Last MA/Cr is as follows: Lab Results  Component Value Date   MICROALBUR <0.7 01/22/2023   -on ACE/ARB lisinopril 20 mg daily -diet changes including salt restriction -limit eating outside -counseled BP targets per standards of diabetes care -uncontrolled blood pressure can lead to retinopathy, nephropathy and cardiovascular and atherosclerotic heart disease  Reviewed and counseled on: -A1C target -Blood sugar targets -Complications of uncontrolled diabetes  -Checking blood sugar before meals and bedtime and bring log next visit -All medications with mechanism of action and side effects -Hypoglycemia management: rule of 15's, Glucagon Emergency Kit and medical alert  ID -low-carb low-fat plate-method diet -At least 20 minutes of physical activity per day -Annual dilated retinal eye exam and foot exam -compliance and follow up needs -follow up as scheduled or earlier if problem gets worse  Call if blood sugar is less than 70 or consistently above 250    Take a 15 gm snack of carbohydrate at bedtime before you go to sleep if your blood sugar is less than 100.    If you are going to fast after midnight for a test or procedure, ask your physician for instructions on how to reduce/decrease your insulin dose.    Call if blood sugar is less than 70 or consistently above 250  -Treating a low sugar by rule of 15  (15 gms of sugar every 15 min until sugar is more than 70) If you feel your sugar is low, test your sugar to be sure If your sugar is low (less than 70), then take 15 grams of a fast acting Carbohydrate (3-4 glucose tablets or glucose gel or 4 ounces of juice or regular soda) Recheck your sugar 15 min after treating low to make sure it is more than 70 If sugar is still less than 70, treat again with 15 grams of carbohydrate          Don't drive the hour of hypoglycemia  If unconscious/unable to eat or drink by mouth, use glucagon injection or nasal spray baqsimi and call 911. Can repeat again in 15 min if still unconscious.  Return in about 3 months (around 04/25/2023).   I have reviewed current medications, nurse's notes, allergies, vital signs, past medical and surgical history, family medical history, and  social history for this encounter. Counseled patient on symptoms, examination findings, lab findings, imaging results, treatment decisions and monitoring and prognosis. The patient understood the recommendations and agrees with the treatment plan. All questions regarding treatment plan were fully answered.  Altamese La Barge, MD  01/23/23    History of Present Illness Noah Austin is a 67 y.o. year old male who presents for evaluation of Type  II diabetes mellitus.  Noah Austin was first diagnosed in 2023.    Home diabetes regimen: Metformin XR 500 mg bid Jardiance 25 mg daily Ozempic 0.5 mg weekly  COMPLICATIONS -  MI/Stroke -  retinopathy -  neuropathy -  nephropathy  BLOOD SUGAR DATA Did not bring meter or log  Physical Exam  BP 120/70 (BP Location: Left Arm, Patient Position: Sitting, Cuff Size: Large)   Pulse 87   Resp 20   Ht 6\' 1"  (1.854 m)   Wt 272 lb 12.8 oz (123.7 kg)   SpO2 95%   BMI 35.99 kg/m    Constitutional: well developed, well nourished Head: normocephalic, atraumatic Eyes: sclera anicteric, no redness Neck: supple Lungs: normal respiratory effort Neurology: alert and oriented Skin: dry, no appreciable rashes Musculoskeletal: no appreciable defects Psychiatric: normal mood and affect Diabetic Foot Exam - Simple   No data filed      Current Medications Patient's Medications  New Prescriptions   No medications on file  Previous Medications   ASPIRIN 81 MG TABLET    Take 81 mg by mouth daily.   EMPAGLIFLOZIN (JARDIANCE) 25 MG TABS TABLET    Take 25 mg by mouth daily.   FUROSEMIDE (LASIX) 20 MG TABLET       GABAPENTIN (NEURONTIN) 100 MG CAPSULE    Take 1 capsule (100 mg total) by mouth 3 (three) times daily.   INDAPAMIDE (LOZOL) 2.5 MG TABLET    Take 1 tablet by mouth every morning.   LISINOPRIL (ZESTRIL) 20 MG TABLET    Take 20 mg by mouth daily.   METFORMIN (GLUCOPHAGE-XR) 500 MG 24 HR TABLET    TAKE 1 TABLET BY MOUTH TWICE DAILY FOR 15 DAYS   METHOCARBAMOL (ROBAXIN) 500 MG TABLET    Take 1 tablet (500 mg total) by mouth every 6 (six) hours as needed for muscle spasms.   METOPROLOL SUCCINATE (TOPROL-XL) 50 MG 24 HR TABLET    Take 1 tablet (50 mg total) by mouth every morning. Take with or immediately following a meal.   MISC NATURAL PRODUCTS (OSTEO BI-FLEX ADV DOUBLE ST PO)    Take by mouth.   MULTIPLE VITAMINS-MINERALS (MULTIVITAMIN WITH MINERALS) TABLET    Take 1 tablet by  mouth daily.   OMEGA-3 FATTY ACIDS (OMEGA 3 PO)    Take 2 capsules by mouth daily.   PANTOPRAZOLE (PROTONIX) 20 MG TABLET    TAKE 1 TABLET BY MOUTH ONCE DAILY 30 MINUTES BEFORE BREAKFAST (MUST MAKE APPOINTMENT FOR FUTURE REFILLS)   PRAVASTATIN (PRAVACHOL) 40 MG TABLET    Take 40 mg by mouth at bedtime.    TADALAFIL (CIALIS) 5 MG TABLET    Take 1 tablet by mouth daily.   ZOLPIDEM (AMBIEN) 10 MG TABLET    Take 10 mg by mouth at bedtime.  Modified Medications   No medications on file  Discontinued Medications   SEMAGLUTIDE,0.25 OR 0.5MG /DOS, (OZEMPIC, 0.25 OR 0.5 MG/DOSE,) 2 MG/3ML SOPN    Inject into the skin.    Allergies Allergies  Allergen Reactions   Codeine Itching   Amlodipine Besylate  Other (See Comments)   Hydrochlorothiazide Other (See Comments)    Past Medical History Past Medical History:  Diagnosis Date   Dyslipidemia    Hyperlipidemia    Hypertension    Obesity (BMI 35.0-39.9 without comorbidity)     Past Surgical History Past Surgical History:  Procedure Laterality Date   HERNIA REPAIR  07/17/11   umbilical hernia   left inguinal hernia     Repair about 27 years ago, about 25 yrs for 2nd surgery   SHOULDER OPEN ROTATOR CUFF REPAIR Right 05/14/2014   Procedure: RIGHT SHOULDER MINI OPEN ROTATOR CUFF REPAIR ;  Surgeon: Javier Docker, MD;  Location: [facility];  Service: Orthopedics;  Laterality: Right;   UMBILICAL HERNIA REPAIR  07/17/2011   Procedure: HERNIA REPAIR UMBILICAL ADULT;  Surgeon: Ernestene Mention, MD;  Location: [facility];  Service: General;  Laterality: N/A;  repair strangulated umbilical hernia    Family History family history includes Cancer in his maternal grandmother; Heart disease in his brother.  Social History Social History   Socioeconomic History   Marital status: Married    Spouse name: Not on file   Number of children: 0   Years of education: Not on file   Highest education [place]: Not on file  Occupational History   Not on file  Tobacco  Use   Smoking status: Former    Current packs/day: 0.00    Types: Cigarettes    Start date: 07/17/1978    Quit date: 07/16/1988    Years since quitting: 34.5   Smokeless tobacco: Never  Vaping Use   Vaping status: Never Used  Substance and Sexual Activity   Alcohol use: No   Drug use: No   Sexual activity: Not on file  Other Topics Concern   Not on file  Social History Narrative   Not on file   Social Determinants of Health   Financial Resource Strain: Not on file  Food Insecurity: Not on file  Transportation Needs: Not on file  Physical Activity: Not on file  Stress: Not on file  Social Connections: Not on file  Intimate Partner Violence: Not on file    Lab Results  Component Value Date   HGBA1C 7.8 (H) 01/22/2023   Lab Results  Component Value Date   CHOL 159 01/22/2023   Lab Results  Component Value Date   HDL 44.70 01/22/2023   Lab Results  Component Value Date   LDLCALC 35 01/22/2023   Lab Results  Component Value Date   TRIG 393.0 (H) 01/22/2023   Lab Results  Component Value Date   CHOLHDL 4 01/22/2023   Lab Results  Component Value Date   CREATININE 0.83 01/22/2023   Lab Results  Component Value Date   GFR 89.55 01/22/2023   Lab Results  Component Value Date   MICROALBUR <0.7 01/22/2023      Component Value Date/Time   NA 135 01/22/2023 0939   K 3.0 (L) 01/22/2023 0939   CL 95 (L) 01/22/2023 0939   CO2 28 01/22/2023 0939   GLUCOSE 185 (H) 01/22/2023 0939   BUN 19 01/22/2023 0939   CREATININE 0.83 01/22/2023 0939   CALCIUM 9.4 01/22/2023 0939   PROT 6.5 01/22/2023 0939   ALBUMIN 4.0 01/22/2023 0939   AST 24 01/22/2023 0939   ALT 28 01/22/2023 0939   ALKPHOS 55 01/22/2023 0939   BILITOT 1.4 (H) 01/22/2023 0939   GFRNONAA >60 06/03/2020 0114   GFRAA >60 07/03/2018 2034  Latest Ref Rng & Units 01/22/2023    9:39 AM 06/03/2020    1:14 AM 07/03/2018    8:34 PM  BMP  Glucose 70 - 99 mg/dL 063  016  010   BUN 6 - 23 mg/dL 19  22   24    Creatinine 0.40 - 1.50 mg/dL 9.32  3.55  7.32   Sodium 135 - 145 mEq/L 135  140  138   Potassium 3.5 - 5.1 mEq/L 3.0  3.5  3.3   Chloride 96 - 112 mEq/L 95  104  103   CO2 19 - 32 mEq/L 28  26  24    Calcium 8.4 - 10.5 mg/dL 9.4  9.0  8.9        Component Value Date/Time   WBC 5.9 06/03/2020 0114   RBC 5.00 06/03/2020 0114   HGB 15.2 06/03/2020 0114   HCT 44.3 06/03/2020 0114   PLT 205 06/03/2020 0114   MCV 88.6 06/03/2020 0114   MCH 30.4 06/03/2020 0114   MCHC 34.3 06/03/2020 0114   RDW 13.2 06/03/2020 0114   LYMPHSABS 1.6 07/17/2011 1525   MONOABS 0.5 07/17/2011 1525   EOSABS 0.2 07/17/2011 1525   BASOSABS 0.0 07/17/2011 1525     Parts of this note may have been dictated using voice recognition software. There may be variances in spelling and vocabulary which are unintentional. Not all errors are proofread. Please notify the Thereasa Parkin if any discrepancies are noted or if the meaning of any statement is not clear.

## 2023-01-23 NOTE — Patient Instructions (Signed)

## 2023-02-05 ENCOUNTER — Ambulatory Visit: Payer: Medicare Other | Admitting: "Endocrinology

## 2023-03-23 ENCOUNTER — Telehealth: Payer: Self-pay | Admitting: *Deleted

## 2023-03-23 NOTE — Telephone Encounter (Signed)
Please call Devarius Nelles in monitor department  at [phone].

## 2023-03-31 DIAGNOSIS — M5416 Radiculopathy, lumbar region: Secondary | ICD-10-CM | POA: Diagnosis not present

## 2023-04-03 DIAGNOSIS — K22711 Barrett's esophagus with high grade dysplasia: Secondary | ICD-10-CM | POA: Diagnosis not present

## 2023-04-03 DIAGNOSIS — Z79899 Other long term (current) drug therapy: Secondary | ICD-10-CM | POA: Diagnosis not present

## 2023-04-03 DIAGNOSIS — Z7984 Long term (current) use of oral hypoglycemic drugs: Secondary | ICD-10-CM | POA: Diagnosis not present

## 2023-04-03 DIAGNOSIS — K2289 Other specified disease of esophagus: Secondary | ICD-10-CM | POA: Diagnosis not present

## 2023-04-03 DIAGNOSIS — Z7982 Long term (current) use of aspirin: Secondary | ICD-10-CM | POA: Diagnosis not present

## 2023-04-03 DIAGNOSIS — Z8719 Personal history of other diseases of the digestive system: Secondary | ICD-10-CM | POA: Diagnosis not present

## 2023-04-25 ENCOUNTER — Ambulatory Visit: Payer: Medicare Other | Admitting: "Endocrinology

## 2023-04-25 ENCOUNTER — Encounter: Payer: Self-pay | Admitting: "Endocrinology

## 2023-04-25 VITALS — BP 114/68 | HR 78 | Ht 73.0 in | Wt 269.0 lb

## 2023-04-25 DIAGNOSIS — Z7984 Long term (current) use of oral hypoglycemic drugs: Secondary | ICD-10-CM

## 2023-04-25 DIAGNOSIS — E782 Mixed hyperlipidemia: Secondary | ICD-10-CM | POA: Diagnosis not present

## 2023-04-25 DIAGNOSIS — Z7985 Long-term (current) use of injectable non-insulin antidiabetic drugs: Secondary | ICD-10-CM

## 2023-04-25 DIAGNOSIS — E1165 Type 2 diabetes mellitus with hyperglycemia: Secondary | ICD-10-CM

## 2023-04-25 LAB — POCT GLYCOSYLATED HEMOGLOBIN (HGB A1C): Hemoglobin A1C: 7.8 % — AB (ref 4.0–5.6)

## 2023-04-25 MED ORDER — DEXCOM G7 SENSOR MISC: 1.0000 | 0 refills | Status: AC

## 2023-04-25 NOTE — Progress Notes (Signed)
 Outpatient Endocrinology Note Noah Birmingham, MD  04/25/23   Noah Austin 25-Oct-1955 [id number]  Referring Provider: Arloa Elsie SAUNDERS, MD Primary Care Provider: Arloa Elsie SAUNDERS, MD Reason for consultation: Subjective   Assessment & Plan  Dezi was seen today for follow-up.  Diagnoses and all orders for this visit:  Uncontrolled type 2 diabetes mellitus with hyperglycemia (HCC) -     POCT glycosylated hemoglobin (Hb A1C)  Long term (current) use of oral hypoglycemic drugs  Long-term (current) use of injectable non-insulin  antidiabetic drugs  Mixed hypercholesterolemia and hypertriglyceridemia  Other orders -     Continuous Glucose Sensor (DEXCOM G7 SENSOR) MISC; 1 Device by Does not apply route continuous.    Diabetes Type II complicated by hyperglycemia,  Lab Results  Component Value Date   GFR 89.55 01/22/2023   Hba1c goal less than 7, current Hba1c is  Lab Results  Component Value Date   HGBA1C 7.8 (A) 04/25/2023   Will recommend the following: Metformin  XR 500 mg bid Jardiance  25 mg daily Ozempic 1 mg weekly (just started this dose) Ordered DexCom   No known contraindications/side effects to any of above medications  -Last LD and Tg are as follows: Lab Results  Component Value Date   LDLCALC 35 01/22/2023    Lab Results  Component Value Date   TRIG 393.0 (H) 01/22/2023   -On pravastatin  40 mg QD -Follow low fat diet and exercise   -Blood pressure goal <140/90 - Microalbumin/creatinine goal is < 30 -Last MA/Cr is as follows: Lab Results  Component Value Date   MICROALBUR <0.7 01/22/2023   -on ACE/ARB lisinopril  20 mg daily -diet changes including salt restriction -limit eating outside -counseled BP targets per standards of diabetes care -uncontrolled blood pressure can lead to retinopathy, nephropathy and cardiovascular and atherosclerotic heart disease  Reviewed and counseled on: -A1C target -Blood sugar  targets -Complications of uncontrolled diabetes  -Checking blood sugar before meals and bedtime and bring log next visit -All medications with mechanism of action and side effects -Hypoglycemia management: rule of 15's, Glucagon Emergency Kit and medical alert ID -low-carb low-fat plate-method diet -At least 20 minutes of physical activity per day -Annual dilated retinal eye exam and foot exam -compliance and follow up needs -follow up as scheduled or earlier if problem gets worse  Call if blood sugar is less than 70 or consistently above 250    Take a 15 gm snack of carbohydrate at bedtime before you go to sleep if your blood sugar is less than 100.    If you are going to fast after midnight for a test or procedure, ask your physician for instructions on how to reduce/decrease your insulin  dose.    Call if blood sugar is less than 70 or consistently above 250  -Treating a low sugar by rule of 15  (15 gms of sugar every 15 min until sugar is more than 70) If you feel your sugar is low, test your sugar to be sure If your sugar is low (less than 70), then take 15 grams of a fast acting Carbohydrate (3-4 glucose tablets or glucose gel or 4 ounces of juice or regular soda) Recheck your sugar 15 min after treating low to make sure it is more than 70 If sugar is still less than 70, treat again with 15 grams of carbohydrate          Don't drive the hour of hypoglycemia  If unconscious/unable to eat or drink by mouth,  use glucagon injection or nasal spray baqsimi and call 911. Can repeat again in 15 min if still unconscious.  Return in about 3 months (around 07/24/2023).   I have reviewed current medications, nurse's notes, allergies, vital signs, past medical and surgical history, family medical history, and social history for this encounter. Counseled patient on symptoms, examination findings, lab findings, imaging results, treatment decisions and monitoring and prognosis. The patient  understood the recommendations and agrees with the treatment plan. All questions regarding treatment plan were fully answered.  Noah Birmingham, MD  04/25/23    History of Present Illness Noah Austin is a 68 y.o. year old male who presents for follow up of Type II diabetes mellitus.  Noah Austin was first diagnosed in 2023.    Home diabetes regimen: Metformin  XR 500 mg bid Jardiance  25 mg daily Ozempic 1 mg weekly  COMPLICATIONS -  MI/Stroke -  retinopathy -  neuropathy -  nephropathy  BLOOD SUGAR DATA Did not bring meter or log Occasionally checks   Physical Exam  BP 114/68 (BP Location: Left Arm, Patient Position: Sitting, Cuff Size: Normal)   Pulse 78   Ht 6' 1 (1.854 m)   Wt 269 lb (122 kg)   SpO2 95%   BMI 35.49 kg/m    Constitutional: well developed, well nourished Head: normocephalic, atraumatic Eyes: sclera anicteric, no redness Neck: supple Lungs: normal respiratory effort Neurology: alert and oriented Skin: dry, no appreciable rashes Musculoskeletal: no appreciable defects Psychiatric: normal mood and affect Diabetic Foot Exam - Simple   No data filed      Current Medications Patient's Medications  New Prescriptions   CONTINUOUS GLUCOSE SENSOR (DEXCOM G7 SENSOR) MISC    1 Device by Does not apply route continuous.  Previous Medications   ASPIRIN  81 MG TABLET    Take 81 mg by mouth daily.   CYCLOBENZAPRINE (FLEXERIL) 10 MG TABLET    Take 1 tablet by mouth at bedtime.   EMPAGLIFLOZIN  (JARDIANCE ) 25 MG TABS TABLET    Take 25 mg by mouth daily.   FUROSEMIDE  (LASIX ) 20 MG TABLET       GABAPENTIN  (NEURONTIN ) 100 MG CAPSULE    Take 1 capsule (100 mg total) by mouth 3 (three) times daily.   INDAPAMIDE  (LOZOL ) 2.5 MG TABLET    Take 1 tablet by mouth every morning.   LANCETS (ONETOUCH DELICA PLUS LANCET33G) MISC    SMARTSIG:1 Topical Daily   LISINOPRIL  (ZESTRIL ) 20 MG TABLET    Take 20 mg by mouth daily.   MELOXICAM (MOBIC) 15 MG TABLET    Take  1 tablet by mouth daily.   METFORMIN  (GLUCOPHAGE -XR) 500 MG 24 HR TABLET    TAKE 1 TABLET BY MOUTH TWICE DAILY FOR 15 DAYS   METHOCARBAMOL  (ROBAXIN ) 500 MG TABLET    Take 1 tablet (500 mg total) by mouth every 6 (six) hours as needed for muscle spasms.   METOPROLOL  SUCCINATE (TOPROL -XL) 50 MG 24 HR TABLET    Take 1 tablet (50 mg total) by mouth every morning. Take with or immediately following a meal.   MISC NATURAL PRODUCTS (OSTEO BI-FLEX ADV DOUBLE ST PO)    Take by mouth.   MULTIPLE VITAMINS-MINERALS (MULTIVITAMIN WITH MINERALS) TABLET    Take 1 tablet by mouth daily.   OMEGA-3 FATTY ACIDS (OMEGA 3 PO)    Take 2 capsules by mouth daily.   ONETOUCH VERIO TEST STRIP    1 each daily.   PANTOPRAZOLE  (PROTONIX ) 20 MG TABLET  TAKE 1 TABLET BY MOUTH ONCE DAILY 30 MINUTES BEFORE BREAKFAST (MUST MAKE APPOINTMENT FOR FUTURE REFILLS)   POTASSIUM CHLORIDE  SA (KLOR-CON  M) 20 MEQ TABLET    Take 20 mEq by mouth daily.   PRAVASTATIN  (PRAVACHOL ) 40 MG TABLET    Take 40 mg by mouth at bedtime.    PREDNISONE (DELTASONE) 10 MG TABLET    Take 10 mg by mouth daily with breakfast.   TADALAFIL (CIALIS) 5 MG TABLET    Take 1 tablet by mouth daily.   ZOLPIDEM  (AMBIEN ) 10 MG TABLET    Take 10 mg by mouth at bedtime.   ZOLPIDEM  (AMBIEN ) 5 MG TABLET    Take 5 mg by mouth at bedtime as needed.  Modified Medications   No medications on file  Discontinued Medications   SEMAGLUTIDE, 1 MG/DOSE, (OZEMPIC, 1 MG/DOSE,) 4 MG/3ML SOPN    Inject 1 mg into the skin once a week.    Allergies Allergies  Allergen Reactions   Codeine Itching   Amlodipine Besylate Other (See Comments)   Hydrochlorothiazide  Other (See Comments)    Past Medical History Past Medical History:  Diagnosis Date   Dyslipidemia    Hyperlipidemia    Hypertension    Obesity (BMI 35.0-39.9 without comorbidity)     Past Surgical History Past Surgical History:  Procedure Laterality Date   HERNIA REPAIR  07/17/11   umbilical hernia   left inguinal  hernia     Repair about 27 years ago, about 25 yrs for 2nd surgery   SHOULDER OPEN ROTATOR CUFF REPAIR Right 05/14/2014   Procedure: RIGHT SHOULDER MINI OPEN ROTATOR CUFF REPAIR ;  Surgeon: Reyes JAYSON Billing, MD;  Location: [facility];  Service: Orthopedics;  Laterality: Right;   UMBILICAL HERNIA REPAIR  07/17/2011   Procedure: HERNIA REPAIR UMBILICAL ADULT;  Surgeon: Elon CHRISTELLA Pacini, MD;  Location: [facility];  Service: General;  Laterality: N/A;  repair strangulated umbilical hernia    Family History family history includes Cancer in his maternal grandmother; Heart disease in his brother.  Social History Social History   Socioeconomic History   Marital status: Married    Spouse name: Not on file   Number of children: 0   Years of education: Not on file   Highest education level: Not on file  Occupational History   Not on file  Tobacco Use   Smoking status: Former    Current packs/day: 0.00    Types: Cigarettes    Start date: 07/17/1978    Quit date: 07/16/1988    Years since quitting: 34.7   Smokeless tobacco: Never  Vaping Use   Vaping status: Never Used  Substance and Sexual Activity   Alcohol use: No   Drug use: No   Sexual activity: Not on file  Other Topics Concern   Not on file  Social History Narrative   Not on file   Social Drivers of Health   Financial Resource Strain: Not on file  Food Insecurity: Not on file  Transportation Needs: Not on file  Physical Activity: Not on file  Stress: Not on file  Social Connections: Not on file  Intimate Partner Violence: Not on file    Lab Results  Component Value Date   HGBA1C 7.8 (A) 04/25/2023   HGBA1C 7.8 (H) 01/22/2023   Lab Results  Component Value Date   CHOL 159 01/22/2023   Lab Results  Component Value Date   HDL 44.70 01/22/2023   Lab Results  Component Value Date  LDLCALC 35 01/22/2023   Lab Results  Component Value Date   TRIG 393.0 (H) 01/22/2023   Lab Results  Component Value Date   CHOLHDL 4  01/22/2023   Lab Results  Component Value Date   CREATININE 0.83 01/22/2023   Lab Results  Component Value Date   GFR 89.55 01/22/2023   Lab Results  Component Value Date   MICROALBUR <0.7 01/22/2023      Component Value Date/Time   NA 135 01/22/2023 0939   K 3.0 (L) 01/22/2023 0939   CL 95 (L) 01/22/2023 0939   CO2 28 01/22/2023 0939   GLUCOSE 185 (H) 01/22/2023 0939   BUN 19 01/22/2023 0939   CREATININE 0.83 01/22/2023 0939   CALCIUM 9.4 01/22/2023 0939   PROT 6.5 01/22/2023 0939   ALBUMIN  4.0 01/22/2023 0939   AST 24 01/22/2023 0939   ALT 28 01/22/2023 0939   ALKPHOS 55 01/22/2023 0939   BILITOT 1.4 (H) 01/22/2023 0939   GFRNONAA >60 06/03/2020 0114   GFRAA >60 07/03/2018 2034      Latest Ref Rng & Units 01/22/2023    9:39 AM 06/03/2020    1:14 AM 07/03/2018    8:34 PM  BMP  Glucose 70 - 99 mg/dL 814  875  881   BUN 6 - 23 mg/dL 19  22  24    Creatinine 0.40 - 1.50 mg/dL 9.16  9.05  9.16   Sodium 135 - 145 mEq/L 135  140  138   Potassium 3.5 - 5.1 mEq/L 3.0  3.5  3.3   Chloride 96 - 112 mEq/L 95  104  103   CO2 19 - 32 mEq/L 28  26  24    Calcium 8.4 - 10.5 mg/dL 9.4  9.0  8.9        Component Value Date/Time   WBC 5.9 06/03/2020 0114   RBC 5.00 06/03/2020 0114   HGB 15.2 06/03/2020 0114   HCT 44.3 06/03/2020 0114   PLT 205 06/03/2020 0114   MCV 88.6 06/03/2020 0114   MCH 30.4 06/03/2020 0114   MCHC 34.3 06/03/2020 0114   RDW 13.2 06/03/2020 0114   LYMPHSABS 1.6 07/17/2011 1525   MONOABS 0.5 07/17/2011 1525   EOSABS 0.2 07/17/2011 1525   BASOSABS 0.0 07/17/2011 1525     Parts of this note may have been dictated using voice recognition software. There may be variances in spelling and vocabulary which are unintentional. Not all errors are proofread. Please notify the dino if any discrepancies are noted or if the meaning of any statement is not clear.

## 2023-04-30 ENCOUNTER — Telehealth: Payer: Self-pay | Admitting: Cardiology

## 2023-04-30 DIAGNOSIS — M51369 Other intervertebral disc degeneration, lumbar region without mention of lumbar back pain or lower extremity pain: Secondary | ICD-10-CM | POA: Diagnosis not present

## 2023-04-30 DIAGNOSIS — M5451 Vertebrogenic low back pain: Secondary | ICD-10-CM | POA: Diagnosis not present

## 2023-04-30 DIAGNOSIS — M5416 Radiculopathy, lumbar region: Secondary | ICD-10-CM | POA: Diagnosis not present

## 2023-04-30 NOTE — Telephone Encounter (Signed)
   Name: Noah Austin  DOB: 20-Oct-1955  MRN: [id number]  Primary Cardiologist: Michele    Preoperative team, please contact this patient and set up a phone call appointment for further preoperative risk assessment. Please obtain consent and complete medication review. Thank you for your help.  I confirm that guidance regarding antiplatelet and oral anticoagulation therapy has been completed and, if necessary, noted below.patient is on ASA but no request to hold.   I also confirmed the patient resides in the state of [place] . As per [facility] Medical Board telemedicine laws, the patient must reside in the state in which the provider is licensed.   Lamarr Satterfield, NP 04/30/2023, 4:38 PM [facility]

## 2023-04-30 NOTE — Telephone Encounter (Signed)
   Pre-operative Risk Assessment    Patient Name: Noah Austin  DOB: 12-17-1955 MRN: [id number]   Date of last office visit: 12/19/2022 Date of next office visit: 06/18/2023   Request for Surgical Clearance    Procedure:   L4-L5 decompression  Date of Surgery:  Clearance TBD                                Surgeon:  Dr. Donaciano Sprang Surgeon's Group or Practice Name:  [facility] Phone number:  [phone] Fax number:  [phone]  Noah Austin [phone])   Type of Clearance Requested:   - Medical    Type of Anesthesia:  Not Indicated   Additional requests/questions:    Bonney Jonel LITTIE Smitty   04/30/2023, 3:36 PM

## 2023-04-30 NOTE — Telephone Encounter (Signed)
 Will send message to surgery scheduler Rosalva Ferron, need to confirm if ASA needs to be held as well as type of anesthesia to be used.

## 2023-05-01 NOTE — Telephone Encounter (Signed)
 Surgery scheduler Joen did reply that the ASA will need to be held as well. General hold time for preop appt is 5-7 days for ASA. I will confirm this with the preop APP about ASA hold.   I will call the pt to schedule tele preop appt. Left message to call back to schedule tele preop appt.

## 2023-05-01 NOTE — Telephone Encounter (Signed)
 I have tried to call surgery scheduler and sent secure chat x 2 to confirm if ASA needs to be held. Unable to reach surgery scheduler.   I will fax notes to surgery scheduler to please reply if ASA needs to be held.

## 2023-05-04 ENCOUNTER — Telehealth: Payer: Self-pay | Admitting: *Deleted

## 2023-05-04 NOTE — Telephone Encounter (Signed)
Pt has been scheduled tele preop 05/16/23. Med rec and consent are done.

## 2023-05-04 NOTE — Telephone Encounter (Signed)
Pt has been scheduled tele preop 05/16/23. Med rec and consent are done.     Patient Consent for Virtual Visit        Noah Austin has provided verbal consent on 05/04/2023 for a virtual visit (video or telephone).   CONSENT FOR VIRTUAL VISIT FOR:  Noah Austin  By participating in this virtual visit I agree to the following:  I hereby voluntarily request, consent and authorize [facility] and its employed or contracted physicians, physician assistants, nurse practitioners or other licensed health care professionals (the Practitioner), to provide me with telemedicine health care services (the "Services") as deemed necessary by the treating Practitioner. I acknowledge and consent to receive the Services by the Practitioner via telemedicine. I understand that the telemedicine visit will involve communicating with the Practitioner through live audiovisual communication technology and the disclosure of certain medical information by electronic transmission. I acknowledge that I have been given the opportunity to request an in-person assessment or other available alternative prior to the telemedicine visit and am voluntarily participating in the telemedicine visit.  I understand that I have the right to withhold or withdraw my consent to the use of telemedicine in the course of my care at any time, without affecting my right to future care or treatment, and that the Practitioner or I may terminate the telemedicine visit at any time. I understand that I have the right to inspect all information obtained and/or recorded in the course of the telemedicine visit and may receive copies of available information for a reasonable fee.  I understand that some of the potential risks of receiving the Services via telemedicine include:  Delay or interruption in medical evaluation due to technological equipment failure or disruption; Information transmitted may not be sufficient (e.g. poor resolution  of images) to allow for appropriate medical decision making by the Practitioner; and/or  In rare instances, security protocols could fail, causing a breach of personal health information.  Furthermore, I acknowledge that it is my responsibility to provide information about my medical history, conditions and care that is complete and accurate to the best of my ability. I acknowledge that Practitioner's advice, recommendations, and/or decision may be based on factors not within their control, such as incomplete or inaccurate data provided by me or distortions of diagnostic images or specimens that may result from electronic transmissions. I understand that the practice of medicine is not an exact science and that Practitioner makes no warranties or guarantees regarding treatment outcomes. I acknowledge that a copy of this consent can be made available to me via my patient portal [facility] MyChart), or I can request a printed copy by calling the office of [facility].    I understand that my insurance will be billed for this visit.   I have read or had this consent read to me. I understand the contents of this consent, which adequately explains the benefits and risks of the Services being provided via telemedicine.  I have been provided ample opportunity to ask questions regarding this consent and the Services and have had my questions answered to my satisfaction. I give my informed consent for the services to be provided through the use of telemedicine in my medical care

## 2023-05-08 ENCOUNTER — Other Ambulatory Visit: Payer: Self-pay | Admitting: Cardiology

## 2023-05-08 DIAGNOSIS — I491 Atrial premature depolarization: Secondary | ICD-10-CM

## 2023-05-14 DIAGNOSIS — E1165 Type 2 diabetes mellitus with hyperglycemia: Secondary | ICD-10-CM | POA: Diagnosis not present

## 2023-05-16 ENCOUNTER — Ambulatory Visit: Payer: Medicare Other | Attending: Physician Assistant | Admitting: Physician Assistant

## 2023-05-16 DIAGNOSIS — E1165 Type 2 diabetes mellitus with hyperglycemia: Secondary | ICD-10-CM | POA: Diagnosis not present

## 2023-05-16 DIAGNOSIS — Z01810 Encounter for preprocedural cardiovascular examination: Secondary | ICD-10-CM

## 2023-05-16 NOTE — Telephone Encounter (Signed)
Notes faxed to surgeon. This phone note will be removed from the preop pool. Tereso Newcomer, PA-C  05/16/2023 2:48 PM

## 2023-05-16 NOTE — Progress Notes (Signed)
   Virtual Visit via Telephone Note   Because of Noah Austin's co-morbid illnesses, he is at least at moderate risk for complications without adequate follow up.  This format is felt to be most appropriate for this patient at this time.  The patient did not have access to video technology/had technical difficulties with video requiring transitioning to audio format only (telephone).  All issues noted in this document were discussed and addressed.  No physical exam could be performed with this format.  Please refer to the patient's chart for his consent to telehealth for [facility].  Evaluation Performed:  Preoperative cardiovascular risk assessment _____________   Date:  05/16/2023   Patient ID:  Noah Austin, DOB 05-16-1955, MRN [id number] Patient Location:  Home - [place] Provider location:   Office  Primary Care Provider:  Noberto Retort, MD Primary Cardiologist:  Tessa Lerner, DO    Chief Complaint / Patient Profile   68 y.o. y/o male with a h/o   Hypertension Hyperlipidemia PACs TTE 05/04/2022: EF 60-65, GR 1 DD Monitor 05/2022: PACs 16.7%, PVCs 1.5% Right bundle branch block Diabetes mellitus Obesity  He is pending lumbar spine surgery with Dr. Shon Baton (anesthesia unknown) and presents today for telephonic preoperative cardiovascular risk assessment.  History of Present Illness    Noah Austin is a 68 y.o. male who presents via audio/video conferencing for a telehealth visit today.  Pt was last seen in cardiology clinic on 12/19/2022 by Dr. Odis Hollingshead.  At that time Noah Austin was doing well.  The patient is now pending procedure as outlined above. Since his last visit, he has done well without chest pain, shortness of breath or syncope.  He has occasional palpitations which have been overall stable.    Physical Exam    Vital Signs:  Noah Austin does not have vital signs available for review today.  Given telephonic nature of communication,  physical exam is limited. AAOx3. NAD. Normal affect.  Speech and respirations are unlabored.  Accessory Clinical Findings    None    Assessment & Plan    Assessment & Plan Preoperative cardiovascular examination [name]'s perioperative risk of a major cardiac event is 0.4% according to the Revised Cardiac Risk Index (RCRI).  Therefore, he is at low risk for perioperative complications.   His functional capacity is good at 4.73 METs according to the Duke Activity Status Index (DASI). Recommendations: According to ACC/AHA guidelines, no further cardiovascular testing needed.  The patient may proceed to surgery at acceptable risk.   Antiplatelet and/or Anticoagulation Recommendations: Aspirin can be held for 5-7 days prior to his surgery.  Please resume Aspirin post operatively when it is felt to be safe from a bleeding standpoint.    The patient was advised that if he develops new symptoms prior to surgery to contact our office to arrange for a follow-up visit, and he verbalized understanding.  A copy of this note will be routed to requesting surgeon.  Time:   Today, I have spent 6 minutes with the patient with telehealth technology discussing medical history, symptoms, and management plan.     Tereso Newcomer, PA-C  05/16/2023, 2:47 PM

## 2023-05-18 ENCOUNTER — Ambulatory Visit (HOSPITAL_COMMUNITY): Payer: Self-pay | Admitting: Orthopedic Surgery

## 2023-05-24 ENCOUNTER — Encounter: Payer: Self-pay | Admitting: *Deleted

## 2023-05-24 ENCOUNTER — Other Ambulatory Visit: Payer: Self-pay | Admitting: Cardiology

## 2023-05-24 ENCOUNTER — Ambulatory Visit: Payer: Medicare Other | Attending: Cardiology

## 2023-05-24 DIAGNOSIS — E119 Type 2 diabetes mellitus without complications: Secondary | ICD-10-CM

## 2023-05-24 DIAGNOSIS — I4510 Unspecified right bundle-branch block: Secondary | ICD-10-CM

## 2023-05-24 DIAGNOSIS — I10 Essential (primary) hypertension: Secondary | ICD-10-CM

## 2023-05-24 DIAGNOSIS — I491 Atrial premature depolarization: Secondary | ICD-10-CM

## 2023-05-24 NOTE — Progress Notes (Unsigned)
 Enrolled for [employer] to mail a ZIO XT long term holter monitor to the patients address on file.  ZIO expected 06/04/23 Follow up with Dr. Albert Huff 06/18/23  Instructions mailed to patient.

## 2023-05-29 DIAGNOSIS — M62830 Muscle spasm of back: Secondary | ICD-10-CM | POA: Diagnosis not present

## 2023-05-29 DIAGNOSIS — I10 Essential (primary) hypertension: Secondary | ICD-10-CM | POA: Diagnosis not present

## 2023-05-29 DIAGNOSIS — I491 Atrial premature depolarization: Secondary | ICD-10-CM | POA: Diagnosis not present

## 2023-05-29 DIAGNOSIS — E1165 Type 2 diabetes mellitus with hyperglycemia: Secondary | ICD-10-CM | POA: Diagnosis not present

## 2023-05-29 DIAGNOSIS — M170 Bilateral primary osteoarthritis of knee: Secondary | ICD-10-CM | POA: Diagnosis not present

## 2023-05-29 DIAGNOSIS — G4733 Obstructive sleep apnea (adult) (pediatric): Secondary | ICD-10-CM | POA: Diagnosis not present

## 2023-05-29 DIAGNOSIS — E7800 Pure hypercholesterolemia, unspecified: Secondary | ICD-10-CM | POA: Diagnosis not present

## 2023-05-29 DIAGNOSIS — K219 Gastro-esophageal reflux disease without esophagitis: Secondary | ICD-10-CM | POA: Diagnosis not present

## 2023-05-29 DIAGNOSIS — G4700 Insomnia, unspecified: Secondary | ICD-10-CM | POA: Diagnosis not present

## 2023-06-06 DIAGNOSIS — M5451 Vertebrogenic low back pain: Secondary | ICD-10-CM | POA: Diagnosis not present

## 2023-06-08 NOTE — Progress Notes (Signed)
 Surgical Instructions   Your procedure is scheduled on Thursday, February 27th, 2025. Report to [facility] Main Entrance "A" at 5:30 A.M., then check in with the Admitting office. Any questions or running late day of surgery: call [phone]  Questions prior to your surgery date: call [phone], Monday-Friday, 8am-4pm. If you experience any cold or flu symptoms such as cough, fever, chills, shortness of breath, etc. between now and your scheduled surgery, please notify us at the above number.     Remember:  Do not eat after midnight the night before your surgery  You may drink clear liquids until 4:30 the morning of your surgery.   Clear liquids allowed are: Water, Non-Citrus Juices (without pulp), Carbonated Beverages, Clear Tea (no milk, honey, etc.), Black Coffee Only (NO MILK, CREAM OR POWDERED CREAMER of any kind), and Gatorade.    Take these medicines the morning of surgery with A SIP OF WATER: Pantoprazole (Protonix)   May take these medicines IF NEEDED: None.   Please follow your surgeon's instructions on when to stop your Aspirin.     One week prior to surgery, STOP taking any Aspirin (unless otherwise instructed by your surgeon) Aleve, Naproxen, Ibuprofen, Motrin, Advil, Goody's, BC's, all herbal medications, fish oil, and non-prescription vitamins.   WHAT DO I DO ABOUT MY DIABETES MEDICATION?   Semalgutide (Ozempic) should be held for 7 days prior to surgery.     Empagliflozin (Jardiance) should be held for 72 hours prior to surgery.  Your last dose should be on Sunday, February 23rd.       Do not take oral diabetes medicines (pills) the morning of surgery.  Do NOT take Metformin on the morning of surgery.    HOW TO MANAGE YOUR DIABETES BEFORE AND AFTER SURGERY  Why is it important to control my blood sugar before and after surgery? Improving blood sugar levels before and after surgery helps healing and can limit problems. A way of improving blood sugar  control is eating a healthy diet by:  Eating less sugar and carbohydrates  Increasing activity/exercise  Talking with your doctor about reaching your blood sugar goals High blood sugars (greater than 180 mg/dL) can raise your risk of infections and slow your recovery, so you will need to focus on controlling your diabetes during the weeks before surgery. Make sure that the doctor who takes care of your diabetes knows about your planned surgery including the date and location.  How do I manage my blood sugar before surgery? Check your blood sugar at least 4 times a day, starting 2 days before surgery, to make sure that the level is not too high or low.  Check your blood sugar the morning of your surgery when you wake up and every 2 hours until you get to the Short Stay unit.  If your blood sugar is less than 70 mg/dL, you will need to treat for low blood sugar: Do not take insulin. Treat a low blood sugar (less than 70 mg/dL) with  cup of clear juice (cranberry or apple), 4 glucose tablets, OR glucose gel. Recheck blood sugar in 15 minutes after treatment (to make sure it is greater than 70 mg/dL). If your blood sugar is not greater than 70 mg/dL on recheck, call [phone] for further instructions. Report your blood sugar to the short stay nurse when you get to Short Stay.  If you are admitted to the hospital after surgery: Your blood sugar will be checked by the staff and you will probably  be given insulin after surgery (instead of oral diabetes medicines) to make sure you have good blood sugar levels. The goal for blood sugar control after surgery is 80-180 mg/dL.                      Do NOT Smoke (Tobacco/Vaping) for 24 hours prior to your procedure.  If you use a CPAP at night, you may bring your mask/headgear for your overnight stay.   You will be asked to remove any contacts, glasses, piercing's, hearing aid's, dentures/partials prior to surgery. Please bring cases for these  items if needed.    Patients discharged the day of surgery will not be allowed to drive home, and someone needs to stay with them for 24 hours.  SURGICAL WAITING ROOM VISITATION Patients may have no more than 2 support people in the waiting area - these visitors may rotate.   Pre-op nurse will coordinate an appropriate time for 1 ADULT support person, who may not rotate, to accompany patient in pre-op.  Children under the age of 67 must have an adult with them who is not the patient and must remain in the main waiting area with an adult.  If the patient needs to stay at the hospital during part of their recovery, the visitor guidelines for inpatient rooms apply.  Please refer to the [facility] website for the visitor guidelines for any additional information.   If you received a COVID test during your pre-op visit  it is requested that you wear a mask when out in public, stay away from anyone that may not be feeling well and notify your surgeon if you develop symptoms. If you have been in contact with anyone that has tested positive in the last 10 days please notify you surgeon.      Pre-operative 5 CHG Bathing Instructions   You can play a key role in reducing the risk of infection after surgery. Your skin needs to be as free of germs as possible. You can reduce the number of germs on your skin by washing with CHG (chlorhexidine gluconate) soap before surgery. CHG is an antiseptic soap that kills germs and continues to kill germs even after washing.   DO NOT use if you have an allergy to chlorhexidine/CHG or antibacterial soaps. If your skin becomes reddened or irritated, stop using the CHG and notify one of our RNs at [phone].   Please shower with the CHG soap starting 4 days before surgery using the following schedule:     Please keep in mind the following:  DO NOT shave, including legs and underarms, starting the day of your first shower.   You may shave your face at any  point before/day of surgery.  Place clean sheets on your bed the day you start using CHG soap. Use a clean washcloth (not used since being washed) for each shower. DO NOT sleep with pets once you start using the CHG.   CHG Shower Instructions:  Wash your face and private area with normal soap. If you choose to wash your hair, wash first with your normal shampoo.  After you use shampoo/soap, rinse your hair and body thoroughly to remove shampoo/soap residue.  Turn the water OFF and apply about 3 tablespoons (45 ml) of CHG soap to a CLEAN washcloth.  Apply CHG soap ONLY FROM YOUR NECK DOWN TO YOUR TOES (washing for 3-5 minutes)  DO NOT use CHG soap on face, private areas, open wounds, or sores.  Pay special attention to the area where your surgery is being performed.  If you are having back surgery, having someone wash your back for you may be helpful. Wait 2 minutes after CHG soap is applied, then you may rinse off the CHG soap.  Pat dry with a clean towel  Put on clean clothes/pajamas   If you choose to wear lotion, please use ONLY the CHG-compatible lotions that are listed below.  Additional instructions for the day of surgery: DO NOT APPLY any lotions, deodorants, cologne, or perfumes.   Do not bring valuables to the hospital. [facility] is not responsible for any belongings/valuables. Do not wear nail polish, gel polish, artificial nails, or any other type of covering on natural nails (fingers and toes) Do not wear jewelry or makeup Put on clean/comfortable clothes.  Please brush your teeth.  Ask your nurse before applying any prescription medications to the skin.     CHG Compatible Lotions   Aveeno Moisturizing lotion  Cetaphil Moisturizing Cream  Cetaphil Moisturizing Lotion  Clairol Herbal Essence Moisturizing Lotion, Dry Skin  Clairol Herbal Essence Moisturizing Lotion, Extra Dry Skin  Clairol Herbal Essence Moisturizing Lotion, Normal Skin  Curel Age Defying Therapeutic  Moisturizing Lotion with Alpha Hydroxy  Curel Extreme Care Body Lotion  Curel Soothing Hands Moisturizing Hand Lotion  Curel Therapeutic Moisturizing Cream, Fragrance-Free  Curel Therapeutic Moisturizing Lotion, Fragrance-Free  Curel Therapeutic Moisturizing Lotion, Original Formula  Eucerin Daily Replenishing Lotion  Eucerin Dry Skin Therapy Plus Alpha Hydroxy Crme  Eucerin Dry Skin Therapy Plus Alpha Hydroxy Lotion  Eucerin Original Crme  Eucerin Original Lotion  Eucerin Plus Crme Eucerin Plus Lotion  Eucerin TriLipid Replenishing Lotion  Keri Anti-Bacterial Hand Lotion  Keri Deep Conditioning Original Lotion Dry Skin Formula Softly Scented  Keri Deep Conditioning Original Lotion, Fragrance Free Sensitive Skin Formula  Keri Lotion Fast Absorbing Fragrance Free Sensitive Skin Formula  Keri Lotion Fast Absorbing Softly Scented Dry Skin Formula  Keri Original Lotion  Keri Skin Renewal Lotion Keri Silky Smooth Lotion  Keri Silky Smooth Sensitive Skin Lotion  Nivea Body Creamy Conditioning Oil  Nivea Body Extra Enriched Lotion  Nivea Body Original Lotion  Nivea Body Sheer Moisturizing Lotion Nivea Crme  Nivea Skin Firming Lotion  NutraDerm 30 Skin Lotion  NutraDerm Skin Lotion  NutraDerm Therapeutic Skin Cream  NutraDerm Therapeutic Skin Lotion  ProShield Protective Hand Cream  Provon moisturizing lotion  Please read over the following fact sheets that you were given.

## 2023-06-11 ENCOUNTER — Encounter (HOSPITAL_COMMUNITY)
Admission: RE | Admit: 2023-06-11 | Discharge: 2023-06-11 | Disposition: A | Discharge status: Home / Self Care | Payer: Medicare Other | Source: Ambulatory Visit | Attending: Orthopedic Surgery | Admitting: Orthopedic Surgery

## 2023-06-11 ENCOUNTER — Other Ambulatory Visit: Payer: Self-pay

## 2023-06-11 ENCOUNTER — Encounter (HOSPITAL_COMMUNITY): Payer: Self-pay

## 2023-06-11 VITALS — BP 91/71 | HR 79 | Temp 97.7°F | Resp 18 | Ht 73.0 in | Wt 264.9 lb

## 2023-06-11 DIAGNOSIS — Z01818 Encounter for other preprocedural examination: Secondary | ICD-10-CM

## 2023-06-11 DIAGNOSIS — I4510 Unspecified right bundle-branch block: Secondary | ICD-10-CM | POA: Diagnosis not present

## 2023-06-11 DIAGNOSIS — G4733 Obstructive sleep apnea (adult) (pediatric): Secondary | ICD-10-CM | POA: Diagnosis not present

## 2023-06-11 DIAGNOSIS — I10 Essential (primary) hypertension: Secondary | ICD-10-CM | POA: Insufficient documentation

## 2023-06-11 DIAGNOSIS — E785 Hyperlipidemia, unspecified: Secondary | ICD-10-CM | POA: Diagnosis not present

## 2023-06-11 DIAGNOSIS — Z01812 Encounter for preprocedural laboratory examination: Secondary | ICD-10-CM | POA: Insufficient documentation

## 2023-06-11 HISTORY — DX: Type 2 diabetes mellitus without complications: E11.9

## 2023-06-11 HISTORY — DX: Sleep apnea, unspecified: G47.30

## 2023-06-11 HISTORY — DX: Barrett's esophagus without dysplasia: K22.70

## 2023-06-11 LAB — BASIC METABOLIC PANEL
Anion gap: 13 (ref 5–15)
BUN: 21 mg/dL (ref 8–23)
CO2: 29 mmol/L (ref 22–32)
Calcium: 10.1 mg/dL (ref 8.9–10.3)
Chloride: 93 mmol/L — ABNORMAL LOW (ref 98–111)
Creatinine, Ser: 1.17 mg/dL (ref 0.61–1.24)
GFR, Estimated: >60 mL/min (ref >60)
Glucose, Bld: 197 mg/dL — ABNORMAL HIGH (ref 70–99)
Potassium: 3.2 mmol/L — ABNORMAL LOW (ref 3.5–5.1)
Sodium: 135 mmol/L (ref 135–145)

## 2023-06-11 LAB — CBC
HCT: 49.3 % (ref 39.0–52.0)
Hemoglobin: 17.6 g/dL — ABNORMAL HIGH (ref 13.0–17.0)
MCH: 31 pg (ref 26.0–34.0)
MCHC: 35.7 g/dL (ref 30.0–36.0)
MCV: 86.8 fL (ref 80.0–100.0)
Platelets: 214 10*3/uL (ref 150–400)
RBC: 5.68 MIL/uL (ref 4.22–5.81)
RDW: 13.2 % (ref 11.5–15.5)
WBC: 6.3 10*3/uL (ref 4.0–10.5)
nRBC: 0 % (ref 0.0–0.2)

## 2023-06-11 LAB — SURGICAL PCR SCREEN
MRSA, PCR: POSITIVE — AB
Staphylococcus aureus: POSITIVE — AB

## 2023-06-11 LAB — GLUCOSE, CAPILLARY: Glucose-Capillary: 222 mg/dL — ABNORMAL HIGH (ref 70–99)

## 2023-06-11 NOTE — Progress Notes (Signed)
 PCP - Tally Joe Cardiologist - Sunit Tolia Endocrinologist - Dr. Altamese Lincoln  PPM/ICD - denies Device Orders - n/a Rep Notified - n/a  Chest x-ray - denies EKG - 12/19/22 Stress Test - denies ECHO - 05/04/22 Cardiac Cath - denies  Sleep Study - OSA+ but does not wear his CPAP at night  Patient states that he is not checking his blood sugar at home.  His blood sugar was 222 on arrival to PAT and he states that he has not eaten this morning but did drink some Pepsi.  Educated patient on the importance of checking his blood sugar 4 times a day prior to surgery.   Patient verbalizes understanding  Last dose of GLP1 agonist-  Ozempic 2/13 - patient aware not to take a dose prior to surgery.    Patient's last dose of Jardiance was 2/24 - patient states that he will not take prior to surgery.    Blood Thinner Instructions: n/a Aspirin Instructions: last dose of Aspirin was 2/22.  ERAS Protcol - clears until 0430   COVID TEST- n/a   Anesthesia review: yes - cardiac clearance  -notified anesthesia of patient's blood pressure - 91/71 - patient denies any symptoms at PAT appointment - he does state that he has had similar readings to this at home but used to be 120s/80s.  He states that he will reach out to his PCP.    Patient denies shortness of breath, fever, cough and chest pain at PAT appointment   All instructions explained to the patient, with a verbal understanding of the material. Patient agrees to go over the instructions while at home for a better understanding. Patient also instructed to self quarantine after being tested for COVID-19. The opportunity to ask questions was provided.

## 2023-06-11 NOTE — Progress Notes (Addendum)
 Message left with Cordelia Pen at Dr. Shon Baton office in regard to patient's surgical PCR result.    Update: 73 - Cordelia Pen is aware of results and is notifying Dr. Shon Baton.

## 2023-06-12 NOTE — Progress Notes (Signed)
 Anesthesia Chart Review:  68 yo male follows with cardiology for hx of HTN, HLD, RBBB, PACs. TTE 05/04/2022: EF 60-65, GR 1 DD. Monitor 05/2022: PACs 16.7%, PVCs 1.5%. Seen by Tereso Newcomer, PA-C for preop eval 05/16/23. Per note, "Mr. Cauthorn's perioperative risk of a major cardiac event is 0.4% according to the Revised Cardiac Risk Index (RCRI).  Therefore, he is at low risk for perioperative complications.   His functional capacity is good at 4.73 METs according to the Duke Activity Status Index (DASI). Recommendations: According to ACC/AHA guidelines, no further cardiovascular testing needed.  The patient may proceed to surgery at acceptable risk. Antiplatelet and/or Anticoagulation Recommendations: Aspirin can be held for 5-7 days prior to his surgery.  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  Preop labs reviewed, unremarkable.   EKG 12/19/22: Sinus rhythm, 81 bpm, frequent PACs, right bundle branch block.  Echocardiogram 05/04/2022:  Normal LV systolic function with visual EF 60-65%. Left ventricle cavity  is normal in size. Mild concentric hypertrophy of the left ventricle.  Normal global wall motion. Doppler evidence of grade I (impaired)  diastolic dysfunction, normal LAP. Calculated EF 65%.  Left atrial cavity is mild to moderately dilated.  Structurally normal tricuspid valve with no regurgitation. No evidence of  pulmonary hypertension.  No prior available for comparison.     Zannie Cove [facility] Short Stay Center/Anesthesiology Phone [phone] 06/12/2023 1:38 PM

## 2023-06-12 NOTE — Anesthesia Preprocedure Evaluation (Signed)
 Anesthesia Evaluation  Patient identified by MRN, date of birth, ID band Patient awake    Reviewed: Allergy & Precautions, NPO status , Patient's Chart, lab work & pertinent test results, reviewed documented beta blocker date and time   History of Anesthesia Complications Negative for: history of anesthetic complications  Airway Mallampati: I  TM Distance: >3 FB Neck ROM: Full    Dental  (+) Edentulous Upper, Partial Lower, Dental Advisory Given   Pulmonary sleep apnea (does not use CPAP) , former smoker   breath sounds clear to auscultation       Cardiovascular hypertension, Pt. on medications and Pt. on home beta blockers (-) angina  Rhythm:Regular Rate:Normal  '24 ECHO: EF 60-65%, mild LVH, Grade 1 DD, no significant valvular abnormalities   Neuro/Psych Back pain    GI/Hepatic Neg liver ROS,GERD  Medicated and Controlled,,Esophageal cancer   Endo/Other  diabetes (glu 209), Oral Hypoglycemic Agents  Semaglutide: last over a week ago BMI 35  Renal/GU negative Renal ROS     Musculoskeletal   Abdominal   Peds  Hematology Hb 17.6, plt 214k   Anesthesia Other Findings   Reproductive/Obstetrics                             Anesthesia Physical Anesthesia Plan  ASA: 3  Anesthesia Plan: General   Post-op Pain Management: Tylenol PO (pre-op)*   Induction: Intravenous  PONV Risk Score and Plan: 2 and Dexamethasone and Ondansetron  Airway Management Planned: Oral ETT  Additional Equipment: None  Intra-op Plan:   Post-operative Plan: Extubation in OR  Informed Consent: I have reviewed the patients History and Physical, chart, labs and discussed the procedure including the risks, benefits and alternatives for the proposed anesthesia with the patient or authorized representative who has indicated his/her understanding and acceptance.     Dental advisory given  Plan Discussed with:  CRNA and Surgeon  Anesthesia Plan Comments: (PAT note by Antionette Poles, PA-C: 68 yo male follows with cardiology for hx of HTN, HLD, RBBB, PACs. TTE 05/04/2022: EF 60-65, GR 1 DD. Monitor 05/2022: PACs 16.7%, PVCs 1.5%. Seen by Tereso Newcomer, PA-C for preop eval 05/16/23. Per note, "Mr. Higginbotham's perioperative risk of a major cardiac event is 0.4% according to the Revised Cardiac Risk Index (RCRI).  Therefore, he is at low risk for perioperative complications.   His functional capacity is good at 4.73 METs according to the Duke Activity Status Index (DASI). Recommendations: According to ACC/AHA guidelines, no further cardiovascular testing needed.  The patient may proceed to surgery at acceptable risk. Antiplatelet and/or Anticoagulation Recommendations: Aspirin can be held for 5-7 days prior to his surgery.  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  Preop labs reviewed, unremarkable.   EKG 12/19/22: Sinus rhythm, 81 bpm, frequent PACs, right bundle branch block.  Echocardiogram 05/04/2022:  Normal LV systolic function with visual EF 60-65%. Left ventricle cavity  is normal in size. Mild concentric hypertrophy of the left ventricle.  Normal global wall motion. Doppler evidence of grade I (impaired)  diastolic dysfunction, normal LAP. Calculated EF 65%.  Left atrial cavity is mild to moderately dilated.  Structurally normal tricuspid valve with no regurgitation. No evidence of  pulmonary hypertension.  No prior available for comparison.   )  Anesthesia Quick Evaluation

## 2023-06-13 DIAGNOSIS — I491 Atrial premature depolarization: Secondary | ICD-10-CM | POA: Diagnosis not present

## 2023-06-14 ENCOUNTER — Other Ambulatory Visit: Payer: Self-pay

## 2023-06-14 ENCOUNTER — Encounter (HOSPITAL_COMMUNITY)
Admission: RE | Disposition: A | Discharge status: Home / Self Care | Payer: Self-pay | Source: Home / Self Care | Attending: Orthopedic Surgery

## 2023-06-14 ENCOUNTER — Ambulatory Visit (HOSPITAL_COMMUNITY)
Admission: RE | Admit: 2023-06-14 | Discharge: 2023-06-15 | Disposition: A | Discharge status: Home / Self Care | Payer: Medicare Other | Attending: Orthopedic Surgery | Admitting: Orthopedic Surgery

## 2023-06-14 ENCOUNTER — Ambulatory Visit (HOSPITAL_COMMUNITY): Payer: Medicare Other

## 2023-06-14 ENCOUNTER — Ambulatory Visit (HOSPITAL_BASED_OUTPATIENT_CLINIC_OR_DEPARTMENT_OTHER): Payer: Medicare Other

## 2023-06-14 ENCOUNTER — Ambulatory Visit (HOSPITAL_COMMUNITY): Payer: Medicare Other | Admitting: Vascular Surgery

## 2023-06-14 DIAGNOSIS — Z9889 Other specified postprocedural states: Secondary | ICD-10-CM

## 2023-06-14 DIAGNOSIS — I10 Essential (primary) hypertension: Secondary | ICD-10-CM | POA: Insufficient documentation

## 2023-06-14 DIAGNOSIS — Z7984 Long term (current) use of oral hypoglycemic drugs: Secondary | ICD-10-CM | POA: Diagnosis not present

## 2023-06-14 DIAGNOSIS — Z7985 Long-term (current) use of injectable non-insulin antidiabetic drugs: Secondary | ICD-10-CM | POA: Diagnosis not present

## 2023-06-14 DIAGNOSIS — Z87891 Personal history of nicotine dependence: Secondary | ICD-10-CM

## 2023-06-14 DIAGNOSIS — Z8501 Personal history of malignant neoplasm of esophagus: Secondary | ICD-10-CM | POA: Diagnosis not present

## 2023-06-14 DIAGNOSIS — M48062 Spinal stenosis, lumbar region with neurogenic claudication: Secondary | ICD-10-CM | POA: Diagnosis not present

## 2023-06-14 DIAGNOSIS — M4316 Spondylolisthesis, lumbar region: Secondary | ICD-10-CM | POA: Diagnosis not present

## 2023-06-14 DIAGNOSIS — E785 Hyperlipidemia, unspecified: Secondary | ICD-10-CM | POA: Diagnosis not present

## 2023-06-14 DIAGNOSIS — G4730 Sleep apnea, unspecified: Secondary | ICD-10-CM | POA: Insufficient documentation

## 2023-06-14 DIAGNOSIS — E119 Type 2 diabetes mellitus without complications: Secondary | ICD-10-CM | POA: Insufficient documentation

## 2023-06-14 DIAGNOSIS — K219 Gastro-esophageal reflux disease without esophagitis: Secondary | ICD-10-CM | POA: Insufficient documentation

## 2023-06-14 DIAGNOSIS — M4310 Spondylolisthesis, site unspecified: Secondary | ICD-10-CM | POA: Diagnosis not present

## 2023-06-14 DIAGNOSIS — K420 Umbilical hernia with obstruction, without gangrene: Secondary | ICD-10-CM

## 2023-06-14 DIAGNOSIS — M48061 Spinal stenosis, lumbar region without neurogenic claudication: Secondary | ICD-10-CM

## 2023-06-14 DIAGNOSIS — M4800 Spinal stenosis, site unspecified: Secondary | ICD-10-CM | POA: Diagnosis present

## 2023-06-14 DIAGNOSIS — G4733 Obstructive sleep apnea (adult) (pediatric): Secondary | ICD-10-CM | POA: Diagnosis not present

## 2023-06-14 DIAGNOSIS — M2578 Osteophyte, vertebrae: Secondary | ICD-10-CM | POA: Insufficient documentation

## 2023-06-14 DIAGNOSIS — Z01818 Encounter for other preprocedural examination: Secondary | ICD-10-CM

## 2023-06-14 DIAGNOSIS — M5116 Intervertebral disc disorders with radiculopathy, lumbar region: Secondary | ICD-10-CM | POA: Diagnosis not present

## 2023-06-14 DIAGNOSIS — Z79899 Other long term (current) drug therapy: Secondary | ICD-10-CM | POA: Diagnosis not present

## 2023-06-14 DIAGNOSIS — E669 Obesity, unspecified: Secondary | ICD-10-CM

## 2023-06-14 DIAGNOSIS — M5416 Radiculopathy, lumbar region: Secondary | ICD-10-CM | POA: Diagnosis not present

## 2023-06-14 LAB — GLUCOSE, CAPILLARY
Glucose-Capillary: 209 mg/dL — ABNORMAL HIGH (ref 70–99)
Glucose-Capillary: 277 mg/dL — ABNORMAL HIGH (ref 70–99)
Glucose-Capillary: 240 mg/dL — ABNORMAL HIGH (ref 70–99)
Glucose-Capillary: 246 mg/dL — ABNORMAL HIGH (ref 70–99)
Glucose-Capillary: 208 mg/dL — ABNORMAL HIGH (ref 70–99)

## 2023-06-14 SURGERY — LUMBAR LAMINECTOMY/DECOMPRESSION MICRODISCECTOMY 1 LEVEL
Anesthesia: General | Site: Spine Lumbar | Laterality: Right

## 2023-06-14 MED ORDER — OXYCODONE HCL 5 MG/5ML PO SOLN: 5.0000 mg | Freq: Once | ORAL | Status: DC | PRN

## 2023-06-14 MED ORDER — MAGNESIUM CITRATE PO SOLN
1.0000 | Freq: Once | ORAL | Status: AC | PRN
  Administered 2023-06-14: 1 via ORAL
  Filled 2023-06-14: qty 296

## 2023-06-14 MED ORDER — MEPERIDINE HCL 25 MG/ML IJ SOLN: 6.2500 mg | INTRAMUSCULAR | Status: DC | PRN

## 2023-06-14 MED ORDER — MIDAZOLAM HCL 2 MG/2ML IJ SOLN
INTRAMUSCULAR | Status: AC
  Filled 2023-06-14: qty 2

## 2023-06-14 MED ORDER — EMPAGLIFLOZIN 25 MG PO TABS
25.0000 mg | ORAL_TABLET | Freq: Every day | ORAL | Status: DC
Start: 2023-06-14 — End: 2023-06-15
  Administered 2023-06-14: 25 mg via ORAL
  Filled 2023-06-14 (×2): qty 1

## 2023-06-14 MED ORDER — FENTANYL CITRATE (PF) 250 MCG/5ML IJ SOLN
INTRAMUSCULAR | Status: DC | PRN
  Administered 2023-06-14: 25 ug via INTRAVENOUS
  Administered 2023-06-14: 50 ug via INTRAVENOUS
  Administered 2023-06-14: 100 ug via INTRAVENOUS
  Administered 2023-06-14: 50 ug via INTRAVENOUS
  Administered 2023-06-14: 25 ug via INTRAVENOUS

## 2023-06-14 MED ORDER — OXYCODONE HCL 5 MG PO TABS: 5.0000 mg | ORAL_TABLET | ORAL | Status: DC | PRN

## 2023-06-14 MED ORDER — POTASSIUM CHLORIDE CRYS ER 20 MEQ PO TBCR
20.0000 meq | EXTENDED_RELEASE_TABLET | Freq: Every day | ORAL | Status: DC
  Administered 2023-06-14: 20 meq via ORAL
  Filled 2023-06-14: qty 1

## 2023-06-14 MED ORDER — MIDAZOLAM HCL 2 MG/2ML IJ SOLN
INTRAMUSCULAR | Status: DC | PRN
  Administered 2023-06-14: 2 mg via INTRAVENOUS

## 2023-06-14 MED ORDER — SURGIFLO WITH THROMBIN (HEMOSTATIC MATRIX KIT) OPTIME
TOPICAL | Status: DC | PRN
  Administered 2023-06-14 (×2): 1

## 2023-06-14 MED ORDER — ONDANSETRON HCL 4 MG/2ML IJ SOLN
INTRAMUSCULAR | Status: DC | PRN
  Administered 2023-06-14: 4 mg via INTRAVENOUS

## 2023-06-14 MED ORDER — FENTANYL CITRATE (PF) 250 MCG/5ML IJ SOLN
INTRAMUSCULAR | Status: AC
  Filled 2023-06-14: qty 5

## 2023-06-14 MED ORDER — PRAVASTATIN SODIUM 40 MG PO TABS
40.0000 mg | ORAL_TABLET | Freq: Every day | ORAL | Status: DC
  Administered 2023-06-14: 40 mg via ORAL
  Filled 2023-06-14: qty 1

## 2023-06-14 MED ORDER — PROPOFOL 10 MG/ML IV BOLUS
INTRAVENOUS | Status: AC
  Filled 2023-06-14: qty 20

## 2023-06-14 MED ORDER — MIDAZOLAM HCL 2 MG/2ML IJ SOLN: 0.5000 mg | Freq: Once | INTRAMUSCULAR | Status: DC | PRN

## 2023-06-14 MED ORDER — CEFAZOLIN SODIUM-DEXTROSE 3-4 GM/150ML-% IV SOLN
3.0000 g | INTRAVENOUS | Status: AC
  Administered 2023-06-14: 2 g via INTRAVENOUS
  Filled 2023-06-14: qty 150

## 2023-06-14 MED ORDER — FUROSEMIDE 20 MG PO TABS
20.0000 mg | ORAL_TABLET | Freq: Every day | ORAL | Status: DC
  Administered 2023-06-14: 20 mg via ORAL
  Filled 2023-06-14: qty 1

## 2023-06-14 MED ORDER — MENTHOL 3 MG MT LOZG: 1.0000 | LOZENGE | OROMUCOSAL | Status: DC | PRN

## 2023-06-14 MED ORDER — SODIUM CHLORIDE 0.9 % IV SOLN: 250.0000 mL | INTRAVENOUS | Status: DC

## 2023-06-14 MED ORDER — ROCURONIUM BROMIDE 10 MG/ML (PF) SYRINGE
PREFILLED_SYRINGE | INTRAVENOUS | Status: DC | PRN
  Administered 2023-06-14 (×2): 5 mg via INTRAVENOUS
  Administered 2023-06-14: 70 mg via INTRAVENOUS
  Administered 2023-06-14: 5 mg via INTRAVENOUS

## 2023-06-14 MED ORDER — INSULIN ASPART 100 UNIT/ML IJ SOLN
0.0000 [IU] | Freq: Every day | INTRAMUSCULAR | Status: DC
  Administered 2023-06-14: 2 [IU] via SUBCUTANEOUS

## 2023-06-14 MED ORDER — CEFAZOLIN SODIUM-DEXTROSE 1-4 GM/50ML-% IV SOLN
1.0000 g | Freq: Three times a day (TID) | INTRAVENOUS | Status: AC
  Administered 2023-06-14 (×2): 1 g via INTRAVENOUS
  Filled 2023-06-14 (×2): qty 50

## 2023-06-14 MED ORDER — BUPIVACAINE-EPINEPHRINE 0.25% -1:200000 IJ SOLN
INTRAMUSCULAR | Status: DC | PRN
  Administered 2023-06-14: 10 mL

## 2023-06-14 MED ORDER — ORAL CARE MOUTH RINSE: 15.0000 mL | Freq: Once | OROMUCOSAL | Status: AC

## 2023-06-14 MED ORDER — THROMBIN 20000 UNITS EX SOLR
CUTANEOUS | Status: AC
  Filled 2023-06-14: qty 20000

## 2023-06-14 MED ORDER — PANTOPRAZOLE SODIUM 20 MG PO TBEC
20.0000 mg | DELAYED_RELEASE_TABLET | Freq: Every day | ORAL | Status: DC
Start: 2023-06-14 — End: 2023-06-15
  Administered 2023-06-14: 20 mg via ORAL
  Filled 2023-06-14: qty 1

## 2023-06-14 MED ORDER — BUPIVACAINE-EPINEPHRINE (PF) 0.25% -1:200000 IJ SOLN
INTRAMUSCULAR | Status: AC
  Filled 2023-06-14: qty 30

## 2023-06-14 MED ORDER — ONDANSETRON HCL 4 MG/2ML IJ SOLN: 4.0000 mg | Freq: Four times a day (QID) | INTRAMUSCULAR | Status: DC | PRN

## 2023-06-14 MED ORDER — LACTATED RINGERS IV BOLUS: 500.0000 mL | Freq: Once | INTRAVENOUS | Status: DC

## 2023-06-14 MED ORDER — POLYETHYLENE GLYCOL 3350 17 G PO PACK: 17.0000 g | PACK | Freq: Every day | ORAL | Status: DC | PRN

## 2023-06-14 MED ORDER — PROPOFOL 10 MG/ML IV BOLUS
INTRAVENOUS | Status: DC | PRN
  Administered 2023-06-14: 140 mg via INTRAVENOUS

## 2023-06-14 MED ORDER — LACTATED RINGERS IV SOLN: INTRAVENOUS | Status: DC

## 2023-06-14 MED ORDER — METHOCARBAMOL 1000 MG/10ML IJ SOLN: 500.0000 mg | Freq: Four times a day (QID) | INTRAMUSCULAR | Status: DC | PRN

## 2023-06-14 MED ORDER — TRANEXAMIC ACID-NACL 1000-0.7 MG/100ML-% IV SOLN
1000.0000 mg | INTRAVENOUS | Status: DC
  Filled 2023-06-14: qty 100

## 2023-06-14 MED ORDER — THROMBIN 20000 UNITS EX SOLR
CUTANEOUS | Status: DC | PRN
  Administered 2023-06-14: 20 mL

## 2023-06-14 MED ORDER — VANCOMYCIN HCL 750 MG/150ML IV SOLN
750.0000 mg | Freq: Two times a day (BID) | INTRAVENOUS | Status: AC
  Administered 2023-06-14 – 2023-06-15 (×2): 750 mg via INTRAVENOUS
  Filled 2023-06-14 (×2): qty 150

## 2023-06-14 MED ORDER — OXYCODONE HCL 5 MG PO TABS: 5.0000 mg | ORAL_TABLET | Freq: Once | ORAL | Status: DC | PRN

## 2023-06-14 MED ORDER — ACETAMINOPHEN 500 MG PO TABS
1000.0000 mg | ORAL_TABLET | Freq: Once | ORAL | Status: AC
  Administered 2023-06-14: 1000 mg via ORAL
  Filled 2023-06-14: qty 2

## 2023-06-14 MED ORDER — SODIUM CHLORIDE 0.9% FLUSH: 3.0000 mL | INTRAVENOUS | Status: DC | PRN

## 2023-06-14 MED ORDER — METFORMIN HCL ER 500 MG PO TB24
1000.0000 mg | ORAL_TABLET | Freq: Every day | ORAL | Status: DC
  Administered 2023-06-15: 1000 mg via ORAL
  Filled 2023-06-14: qty 2

## 2023-06-14 MED ORDER — OXYCODONE-ACETAMINOPHEN 10-325 MG PO TABS: 1.0000 | ORAL_TABLET | Freq: Four times a day (QID) | ORAL | 0 refills | Status: AC | PRN

## 2023-06-14 MED ORDER — METHYLPREDNISOLONE ACETATE 40 MG/ML IJ SUSP
INTRAMUSCULAR | Status: AC
Start: 2023-06-14 — End: ?
  Filled 2023-06-14: qty 1

## 2023-06-14 MED ORDER — HYDROMORPHONE HCL 1 MG/ML IJ SOLN: 1.0000 mg | INTRAMUSCULAR | Status: DC | PRN

## 2023-06-14 MED ORDER — PHENYLEPHRINE HCL-NACL 20-0.9 MG/250ML-% IV SOLN
INTRAVENOUS | Status: DC | PRN
  Administered 2023-06-14: 30 ug/min via INTRAVENOUS

## 2023-06-14 MED ORDER — INDAPAMIDE 1.25 MG PO TABS
2.5000 mg | ORAL_TABLET | Freq: Every morning | ORAL | Status: DC
  Filled 2023-06-14 (×2): qty 1
  Filled 2023-06-14: qty 2

## 2023-06-14 MED ORDER — OXYCODONE HCL 5 MG PO TABS
10.0000 mg | ORAL_TABLET | ORAL | Status: DC | PRN
  Administered 2023-06-14 – 2023-06-15 (×6): 10 mg via ORAL
  Filled 2023-06-14 (×5): qty 2

## 2023-06-14 MED ORDER — VANCOMYCIN HCL 1500 MG/300ML IV SOLN
1500.0000 mg | INTRAVENOUS | Status: AC
  Administered 2023-06-14: 1500 mg via INTRAVENOUS
  Filled 2023-06-14: qty 300

## 2023-06-14 MED ORDER — METHOCARBAMOL 500 MG PO TABS
500.0000 mg | ORAL_TABLET | Freq: Three times a day (TID) | ORAL | 0 refills | Status: AC | PRN
Start: 2023-06-14 — End: 2023-06-19

## 2023-06-14 MED ORDER — OXYCODONE HCL 5 MG PO TABS
ORAL_TABLET | ORAL | Status: AC
  Filled 2023-06-14: qty 2

## 2023-06-14 MED ORDER — HYDROMORPHONE HCL 1 MG/ML IJ SOLN: 0.2500 mg | INTRAMUSCULAR | Status: DC | PRN

## 2023-06-14 MED ORDER — ACETAMINOPHEN 325 MG PO TABS: 650.0000 mg | ORAL_TABLET | ORAL | Status: DC | PRN

## 2023-06-14 MED ORDER — SODIUM CHLORIDE 0.9% FLUSH
3.0000 mL | Freq: Two times a day (BID) | INTRAVENOUS | Status: DC
  Administered 2023-06-14: 3 mL via INTRAVENOUS

## 2023-06-14 MED ORDER — PHENOL 1.4 % MT LIQD: 1.0000 | OROMUCOSAL | Status: DC | PRN

## 2023-06-14 MED ORDER — PHENYLEPHRINE 80 MCG/ML (10ML) SYRINGE FOR IV PUSH (FOR BLOOD PRESSURE SUPPORT)
PREFILLED_SYRINGE | INTRAVENOUS | Status: DC | PRN
  Administered 2023-06-14 (×3): 80 ug via INTRAVENOUS
  Administered 2023-06-14: 160 ug via INTRAVENOUS
  Administered 2023-06-14: 80 ug via INTRAVENOUS
  Administered 2023-06-14: 240 ug via INTRAVENOUS
  Administered 2023-06-14: 80 ug via INTRAVENOUS

## 2023-06-14 MED ORDER — 0.9 % SODIUM CHLORIDE (POUR BTL) OPTIME
TOPICAL | Status: DC | PRN
  Administered 2023-06-14 (×2): 1000 mL

## 2023-06-14 MED ORDER — ONDANSETRON HCL 4 MG PO TABS: 4.0000 mg | ORAL_TABLET | Freq: Four times a day (QID) | ORAL | Status: DC | PRN

## 2023-06-14 MED ORDER — INSULIN ASPART 100 UNIT/ML IJ SOLN
0.0000 [IU] | Freq: Three times a day (TID) | INTRAMUSCULAR | Status: DC
  Administered 2023-06-14 – 2023-06-15 (×2): 5 [IU] via SUBCUTANEOUS

## 2023-06-14 MED ORDER — DEXAMETHASONE SODIUM PHOSPHATE 10 MG/ML IJ SOLN
INTRAMUSCULAR | Status: DC | PRN
  Administered 2023-06-14: 5 mg via INTRAVENOUS

## 2023-06-14 MED ORDER — ONDANSETRON HCL 4 MG PO TABS: 4.0000 mg | ORAL_TABLET | Freq: Three times a day (TID) | ORAL | 0 refills | Status: AC | PRN

## 2023-06-14 MED ORDER — ALBUMIN HUMAN 5 % IV SOLN: INTRAVENOUS | Status: DC | PRN

## 2023-06-14 MED ORDER — INSULIN ASPART 100 UNIT/ML IJ SOLN
0.0000 [IU] | INTRAMUSCULAR | Status: DC | PRN
  Administered 2023-06-14: 4 [IU] via SUBCUTANEOUS

## 2023-06-14 MED ORDER — LIDOCAINE 2% (20 MG/ML) 5 ML SYRINGE
INTRAMUSCULAR | Status: DC | PRN
  Administered 2023-06-14: 40 mg via INTRAVENOUS

## 2023-06-14 MED ORDER — METHOCARBAMOL 500 MG PO TABS
500.0000 mg | ORAL_TABLET | Freq: Four times a day (QID) | ORAL | Status: DC | PRN
  Administered 2023-06-14 – 2023-06-15 (×3): 500 mg via ORAL
  Filled 2023-06-14 (×3): qty 1

## 2023-06-14 MED ORDER — METOPROLOL SUCCINATE ER 50 MG PO TB24: 50.0000 mg | ORAL_TABLET | Freq: Every evening | ORAL | Status: DC

## 2023-06-14 MED ORDER — CHLORHEXIDINE GLUCONATE 0.12 % MT SOLN
15.0000 mL | Freq: Once | OROMUCOSAL | Status: AC
  Administered 2023-06-14: 15 mL via OROMUCOSAL
  Filled 2023-06-14: qty 15

## 2023-06-14 MED ORDER — EPHEDRINE SULFATE-NACL 50-0.9 MG/10ML-% IV SOSY
PREFILLED_SYRINGE | INTRAVENOUS | Status: DC | PRN
  Administered 2023-06-14: 7.5 mg via INTRAVENOUS
  Administered 2023-06-14 (×2): 5 mg via INTRAVENOUS

## 2023-06-14 MED ORDER — LISINOPRIL 20 MG PO TABS
20.0000 mg | ORAL_TABLET | Freq: Every day | ORAL | Status: DC
  Administered 2023-06-14: 20 mg via ORAL
  Filled 2023-06-14: qty 1

## 2023-06-14 MED ORDER — SUGAMMADEX SODIUM 200 MG/2ML IV SOLN
INTRAVENOUS | Status: DC | PRN
  Administered 2023-06-14: 100 mg via INTRAVENOUS
  Administered 2023-06-14: 200 mg via INTRAVENOUS

## 2023-06-14 MED ORDER — ACETAMINOPHEN 650 MG RE SUPP: 650.0000 mg | RECTAL | Status: DC | PRN

## 2023-06-14 SURGICAL SUPPLY — 53 items
BAG COUNTER SPONGE SURGICOUNT (BAG) ×2 IMPLANT
BENZOIN TINCTURE PRP APPL 2/3 (GAUZE/BANDAGES/DRESSINGS) IMPLANT
BNDG GAUZE DERMACEA FLUFF 4 (GAUZE/BANDAGES/DRESSINGS) ×2 IMPLANT
CANISTER SUCT 3000ML PPV (MISCELLANEOUS) ×2 IMPLANT
CLSR STERI-STRIP ANTIMIC 1/2X4 (GAUZE/BANDAGES/DRESSINGS) ×2 IMPLANT
CORD BIPOLAR FORCEPS 12FT (ELECTRODE) ×2 IMPLANT
COVER SURGICAL LIGHT HANDLE (MISCELLANEOUS) ×2 IMPLANT
DRAIN CHANNEL 15F RND FF W/TCR (WOUND CARE) IMPLANT
DRAPE SURG 17X23 STRL (DRAPES) ×2 IMPLANT
DRAPE U-SHAPE 47X51 STRL (DRAPES) ×2 IMPLANT
DRSG OPSITE POSTOP 3X4 (GAUZE/BANDAGES/DRESSINGS) ×2 IMPLANT
DRSG OPSITE POSTOP 4X6 (GAUZE/BANDAGES/DRESSINGS) IMPLANT
DURAPREP 26ML APPLICATOR (WOUND CARE) ×2 IMPLANT
ELECT BLADE 4.0 EZ CLEAN MEGAD (MISCELLANEOUS) IMPLANT
ELECT CAUTERY BLADE 6.4 (BLADE) ×2 IMPLANT
ELECT PENCIL ROCKER SW 15FT (MISCELLANEOUS) ×2 IMPLANT
ELECT REM PT RETURN 9FT ADLT (ELECTROSURGICAL) ×1 IMPLANT
ELECTRODE BLDE 4.0 EZ CLN MEGD (MISCELLANEOUS) IMPLANT
ELECTRODE REM PT RTRN 9FT ADLT (ELECTROSURGICAL) ×2 IMPLANT
EVACUATOR SILICONE 100CC (DRAIN) IMPLANT
GLOVE BIO SURGEON STRL SZ 6.5 (GLOVE) ×2 IMPLANT
GLOVE BIOGEL PI IND STRL 6.5 (GLOVE) ×2 IMPLANT
GLOVE BIOGEL PI IND STRL 8.5 (GLOVE) ×2 IMPLANT
GLOVE SS BIOGEL STRL SZ 8.5 (GLOVE) ×2 IMPLANT
GOWN STRL REUS W/ TWL LRG LVL3 (GOWN DISPOSABLE) ×4 IMPLANT
GOWN STRL REUS W/TWL 2XL LVL3 (GOWN DISPOSABLE) ×2 IMPLANT
KIT BASIN OR (CUSTOM PROCEDURE TRAY) ×2 IMPLANT
KIT TURNOVER KIT B (KITS) ×2 IMPLANT
NDL 22X1.5 STRL (OR ONLY) (MISCELLANEOUS) ×2 IMPLANT
NDL SPNL 18GX3.5 QUINCKE PK (NEEDLE) ×4 IMPLANT
NEEDLE 22X1.5 STRL (OR ONLY) (MISCELLANEOUS) ×1 IMPLANT
NEEDLE SPNL 18GX3.5 QUINCKE PK (NEEDLE) ×2 IMPLANT
NS IRRIG 1000ML POUR BTL (IV SOLUTION) ×2 IMPLANT
PACK LAMINECTOMY ORTHO (CUSTOM PROCEDURE TRAY) ×2 IMPLANT
PACK UNIVERSAL I (CUSTOM PROCEDURE TRAY) ×2 IMPLANT
PAD ARMBOARD 7.5X6 YLW CONV (MISCELLANEOUS) ×4 IMPLANT
PATTIES SURGICAL .5 X.5 (GAUZE/BANDAGES/DRESSINGS) ×2 IMPLANT
PATTIES SURGICAL .5 X1 (DISPOSABLE) ×2 IMPLANT
SPONGE SURGIFOAM ABS GEL 100 (HEMOSTASIS) IMPLANT
SPONGE T-LAP 4X18 ~~LOC~~+RFID (SPONGE) ×6 IMPLANT
SURGIFLO W/THROMBIN 8M KIT (HEMOSTASIS) IMPLANT
SUT BONE WAX W31G (SUTURE) ×2 IMPLANT
SUT MNCRL AB 3-0 PS2 27 (SUTURE) IMPLANT
SUT MNCRL+ AB 3-0 CT1 36 (SUTURE) ×2 IMPLANT
SUT STRATAFIX 1PDS 45CM VIOLET (SUTURE) IMPLANT
SUT VIC AB 1 CT1 18XCR BRD 8 (SUTURE) ×2 IMPLANT
SUT VIC AB 2-0 CT1 18 (SUTURE) ×2 IMPLANT
SYR BULB IRRIG 60ML STRL (SYRINGE) ×2 IMPLANT
SYR CONTROL 10ML LL (SYRINGE) ×2 IMPLANT
TOWEL GREEN STERILE (TOWEL DISPOSABLE) ×2 IMPLANT
TOWEL GREEN STERILE FF (TOWEL DISPOSABLE) ×2 IMPLANT
WATER STERILE IRR 1000ML POUR (IV SOLUTION) ×2 IMPLANT
YANKAUER SUCT BULB TIP NO VENT (SUCTIONS) IMPLANT

## 2023-06-14 NOTE — Anesthesia Procedure Notes (Signed)
 Procedure Name: Intubation Date/Time: 06/14/2023 7:55 AM  Performed by: Darlina Guys, CRNAPre-anesthesia Checklist: Patient identified, Emergency Drugs available, Suction available and Patient being monitored Patient Re-evaluated:Patient Re-evaluated prior to induction Oxygen Delivery Method: Circle System Utilized Preoxygenation: Pre-oxygenation with 100% oxygen Induction Type: IV induction Ventilation: Mask ventilation without difficulty and Oral airway inserted - appropriate to patient size Laryngoscope Size: Mac and 4 Grade View: Grade I Tube type: Oral Tube size: 7.5 mm Number of attempts: 1 Airway Equipment and Method: Stylet and Oral airway Placement Confirmation: ETT inserted through vocal cords under direct vision, positive ETCO2 and breath sounds checked- equal and bilateral Secured at: 23 cm Tube secured with: Tape Dental Injury: Teeth and Oropharynx as per pre-operative assessment  Comments: Atraumatic induction/intubation. Dentition and oral mucosa as per preop (baseline partial dentures upper and lower)

## 2023-06-14 NOTE — Discharge Instructions (Signed)

## 2023-06-14 NOTE — Op Note (Signed)
 OPERATIVE REPORT  DATE OF SURGERY: 06/14/2023  PATIENT NAME:  Noah Austin MRN: [id number] DOB: 01-Oct-1955  PCP: Noberto Retort, MD  PRE-OPERATIVE DIAGNOSIS: Lumbar spinal stenosis L4-5 with large right exostosis emanating from the L4-5 facet.  POST-OPERATIVE DIAGNOSIS: Same  PROCEDURE: L4-5 decompression with medial facetectomy and foraminotomy and near complete right-sided hemilaminotomy.  Excision of large exostosis from right L4-5 facet.  SURGEON:  Venita Lick, MD  PHYSICIAN ASSISTANT: Luther Bradley, [place]  ANESTHESIA:   General  EBL: 100 ml   Complications: None  BRIEF HISTORY: Noah Austin is a 68 y.o. male who then with complaints of severe back buttock and radicular leg pain right side worse than the left.  Imaging showed severe spinal stenosis at L4-5 primarily due to right ossified mass/bone spur emanating from the medial aspect of the L4-5 facet.  Attempts at conservative management failed to alleviate her symptoms and so we elected to move forward with surgery.  All risks/benefits were explained and consent was obtained.  PROCEDURE DETAILS: Patient was brought into the operating room and was properly positioned on the operating room table.  After induction with general anesthesia the patient was endotracheally intubated.  A timeout was taken to confirm all important data: including patient, procedure, and the level. Teds, SCD's were applied.   A Foley was placed by the nurse and the patient was turned prone onto the Wilson frame.  All bony prominences were well-padded and the back was prepped and draped in a standard fashion.  2 needles were placed in the spine to mark out my incision.  X-ray confirmed we are at the L4-5 level.  I marked out my incision and infiltrated with quarter percent Marcaine with epinephrine.  Midline incision was made and sharp dissection was carried out down to the deep fascia.  I incised the deep fascia and stripped the paraspinal muscles to  expose the L4 and L5 spinous process, L4 lamina, and the L4-5 facet complex.  Penfield 4 was placed underneath the L4 lamina and a x-ray was taken confirming I was at the appropriate level.  Retractor was placed for visualization.  The interspinous process ligament at L4-5 was removed with a double-action Leksell rongeur and a lamina spreader was placed to distract the space.  There was significant overgrowth of the L4-5 facet on the right side consistent with what was seen on the preoperative MRI.  In order to adequately decompress the right side I elected to first do a left-sided hemilaminotomy with a 3 mm Kerrison rongeur.  This allowed me to identify the ligamentum flavum and developed a plane between the thecal sac and the ligamentum flavum.  I then remove the ligamentum flavum and began working into the left lateral recess.  A medial facetectomy was performed as well as the left medial foraminotomy of L5.  With this left side now decompressed I could now gently retract the thecal sac towards the left and develop a plane between the thecal sac and the large bony osteophyte.  Using the Hastings 4 I gently began creating my plane.  Once I had safely create a plane I was able to use my Kerrison rongeur to resect the osteophyte.  I also ended up resecting the majority of the inferior L4 facet performing essentially a subtotal Gill decompression on this right side.  Once I had the osteophyte removed I could now begin dissecting into the lateral recess.  I was able to mobilize the L5 nerve root and then trim down  the medial aspect of the superior L5 facet.  I continued my decompression until I was able to palpate the medial aspect of the L5 pedicle.  I then performed a medial foraminotomy on this right side as well.  I can now freely pass my nerve hook in the lateral recess and out the L5 foramen as well as underneath the thecal sac centrally.  There was still was large bone spur superiorly.  At this point I  continued my dissection superiorly performing a subtotal right sided hemilaminotomy to adequately decompress this area.  The thecal sac now could easily expand into the space and was no longer under compression.  I could freely pass my nerve hook superiorly in the lateral recess to the level of the L4 foramen.  I confirmed that my decompression spanned the area of maximum neural compression is based on the preoperative MRI.  Once the decompression proved satisfactory I used bipolar cautery and Floseal to obtain hemostasis.  I then irrigated the wound copiously with normal saline and then placed a thrombin-soaked Gelfoam patty over the decompression site.  I remove the retractors and closed the deep fascia with a running #1 strata fix.  This was followed by interrupted 2-0 Vicryl sutures and a 3-0 Monocryl for the Steri-Strips dry dressings were applied and the patient was ultimately extubated and transferred the PACU without incident.  At the end of the case all needle and sponge counts were correct.  Venita Lick, MD 06/14/2023 10:14 AM

## 2023-06-14 NOTE — OR Nursing (Signed)
 Per Dr. Princella Pellegrini Radiologist, Dr. Shon Baton is working at lumbar four-lumbar five disc space.

## 2023-06-14 NOTE — Transfer of Care (Signed)
 Immediate Anesthesia Transfer of Care Note  Patient: Noah Austin  Procedure(s) Performed: LUMBAR DECOMPRESSION LUMBAR FOUR - FIVE (Right: Spine Lumbar)  Patient Location: PACU  Anesthesia Type:General  Level of Consciousness: awake, alert , and patient cooperative  Airway & Oxygen Therapy: Patient Spontanous Breathing and Patient connected to face mask oxygen  Post-op Assessment: Report given to RN and Post -op Vital signs reviewed and stable  Post vital signs: Reviewed and stable  Last Vitals:  Vitals Value Taken Time  BP 98/56 06/14/23 1045  Temp 37.3 C 06/14/23 1045  Pulse 89 06/14/23 1046  Resp 14 06/14/23 1046  SpO2 95 % 06/14/23 1046  Vitals shown include unfiled device data.  Last Pain:  Vitals:   06/14/23 0639  TempSrc:   PainSc: 0-No pain         Complications: No notable events documented.

## 2023-06-14 NOTE — Anesthesia Postprocedure Evaluation (Signed)
 Anesthesia Post Note  Patient: Noah Austin  Procedure(s) Performed: LUMBAR DECOMPRESSION LUMBAR FOUR - FIVE (Right: Spine Lumbar)     Patient location during evaluation: PACU Anesthesia Type: General Level of consciousness: awake and alert, patient cooperative and oriented Pain management: pain level controlled Vital Signs Assessment: post-procedure vital signs reviewed and stable Respiratory status: spontaneous breathing, nonlabored ventilation and respiratory function stable Cardiovascular status: blood pressure returned to baseline and stable Postop Assessment: no apparent nausea or vomiting Anesthetic complications: no   No notable events documented.  Last Vitals:  Vitals:   06/14/23 1115 06/14/23 1130  BP: 103/77 (!) 110/45  Pulse: 91 88  Resp: 17 19  Temp:  36.7 C  SpO2: 92% 97%    Last Pain:  Vitals:   06/14/23 1045  TempSrc:   PainSc: 0-No pain                 Steven Veazie,E. Marwan Lipe

## 2023-06-14 NOTE — Brief Op Note (Signed)
 06/14/2023  10:27 AM  PATIENT:  Noah Austin  68 y.o. male  PRE-OPERATIVE DIAGNOSIS:  lumbar spinal stenosis  POST-OPERATIVE DIAGNOSIS:  lumbar spinal stenosis  PROCEDURE:  Procedure(s) with comments: LUMBAR DECOMPRESSION LUMBAR FOUR - FIVE (Right) -  SURGEON:  Surgeons and Role:    Venita Lick, MD - Primary  PHYSICIAN ASSISTANT: Luther Bradley, PA    ANESTHESIA:   general  EBL:  100 mL   BLOOD ADMINISTERED:none  DRAINS: none   LOCAL MEDICATIONS USED:  MARCAINE     SPECIMEN:  No Specimen  DISPOSITION OF SPECIMEN:  N/A  COUNTS:  YES  TOURNIQUET:  * No tourniquets in log *  DICTATION: .Dragon Dictation  PLAN OF CARE: Admit for overnight observation  PATIENT DISPOSITION:  PACU - hemodynamically stable.

## 2023-06-14 NOTE — H&P (Signed)
 History:  The patient is a 68 year old male who presents for pre-operative visit in preparation for their RT decompression L4-5, which is scheduled on 06-14-23 with Dr. Venita Lick, MD at [facility]. The patient has had symptoms in the Back including pain which has impacted their quality of life and ability to do activities of daily living. The patient currently has a diagnosis of lumbar radiculopathy and has failed conservative treatments including activity modification. The patient has had no previous surgery . The patient denies an active infection.   Past Medical History:  Diagnosis Date   Barrett's esophagus    Diabetes mellitus without complication (HCC)    type 2   Dyslipidemia    Hyperlipidemia    Hypertension    Obesity (BMI 35.0-39.9 without comorbidity)    Sleep apnea     Allergies  Allergen Reactions   Codeine Itching   Amlodipine Besylate Other (See Comments)    Unsure of Reaction   Hydrochlorothiazide Other (See Comments)    Unsure of Reaction    No current facility-administered medications on file prior to encounter.   Current Outpatient Medications on File Prior to Encounter  Medication Sig Dispense Refill   aspirin 81 MG tablet Take 81 mg by mouth at bedtime.     empagliflozin (JARDIANCE) 25 MG TABS tablet Take 25 mg by mouth daily.     furosemide (LASIX) 20 MG tablet Take 20 mg by mouth daily.     indapamide (LOZOL) 2.5 MG tablet Take 2.5 mg by mouth every morning.     lisinopril (ZESTRIL) 20 MG tablet Take 20 mg by mouth daily.     metFORMIN (GLUCOPHAGE-XR) 500 MG 24 hr tablet Take 1,000 mg by mouth daily with breakfast.     metoprolol succinate (TOPROL-XL) 50 MG 24 hr tablet TAKE 1 TABLET BY MOUTH EVERY  MORNING WITH OR IMMEDIATELY  FOLLOWING A MEAL (Patient taking differently: Take 50 mg by mouth every evening.) 90 tablet 2   Misc Natural Products (OSTEO BI-FLEX ADV TRIPLE ST) TABS Take 2 tablets by mouth daily.     Multiple  Vitamins-Minerals (MULTIVITAMIN WITH MINERALS) tablet Take 1 tablet by mouth daily.     Omega-3 Fatty Acids (FISH OIL) 1000 MG CAPS Take 1,000 mg by mouth daily.     pantoprazole (PROTONIX) 20 MG tablet Take 20 mg by mouth daily.     potassium chloride SA (KLOR-CON M) 20 MEQ tablet Take 20 mEq by mouth daily.     pravastatin (PRAVACHOL) 40 MG tablet Take 40 mg by mouth at bedtime.      tadalafil (CIALIS) 5 MG tablet Take 5 mg by mouth daily.     zolpidem (AMBIEN) 5 MG tablet Take 5 mg by mouth at bedtime.     Continuous Glucose Sensor (DEXCOM G7 SENSOR) MISC 1 Device by Does not apply route continuous. 9 each 0   Lancets (ONETOUCH DELICA PLUS LANCET33G) MISC SMARTSIG:1 Topical Daily     ONETOUCH VERIO test strip 1 each daily.     Semaglutide, 1 MG/DOSE, (OZEMPIC, 1 MG/DOSE,) 4 MG/3ML SOPN Inject 1 mg into the skin once a week.      Physical Exam: Vitals:   06/14/23 0548  BP: (!) 147/70  Pulse: 83  Resp: 19  Temp: 97.9 F (36.6 C)  SpO2: 95%   Body mass index is 34.57 kg/m.  General: AAOX3, well developed and well nourished, NAD Ambulation Normal uses no assitive devices Heart: RRR. Lungs:  Normal pulmonary effort, no signs of respiratory distress Abdomen: Normal bowel soundsX4, soft, non-tender, no hepatosplenomegaly. Skin: No abnormal lesions, abrasions or contusions. MSK: No obvious bony abnormalities, tenderness to palpation over lumbar spine AROM Lumbar Spine: -Spine: normal ROM, pain elicited with fwd flexion and extension  - Knee: flexion and extension normal and pain free bilaterally.  - Ankle: Dorsiflexion, plantarflexion, inversion, eversion normal and pain free.  Dermatomes:Lower extremity sensation to light touch intact bilaterally with positive dysathesias on the right.  Myotomes:  - L2: Left 5/5, Right 5/5  -L3: Left 5/5, Right 5/5  - L4: Left 5/5, Right 5/5  - L5: Left 5/5, Right 5/5  - S1: Left 5/5, Right 5/5    Reflexes:  - Patella: Left2+,  Right 2+  Achilles: Left2+, Right 2+  - Babinski: Left Negative, Right Negative  - Clonus: Negative  Special Tests:  - Straight Leg Raise: Left Negative, Right Negative - FABER Test:Negative  PV: Extremities warm and well perfused. Posterior and dorsalis pedis pulse 2+ bilaterally, No pitting Edema, discoloration, calf tenderness, or palpable cords. Homan's negative bilaterally.    X-Ray impression:   3vLumbar: Evidence of degenerative scoliosis, no misalignment of the spine, or abnormal rotation. Bilateral SI joints visualized and show no signs of joint space narrowing bilaterally. Bilateral Hip joints visualized and show no signs of joint space narrowing or other bony abnormalities bilaterally. Evidence of a grade 1 L4-L5 spondylolisthesis. Signs of chronic DDD worst at L4-L5 with disc space narrowing. No signs of fractures, bony lesions, or other bony abnormalities.  The Lumbar MRI preformed on 12/12/2022: severe L4-L5 spinal canal stenosis with severe right subarticular zone narrowing with severe right lateral recess narrowing that is impinging on the L5 nerve root.   A/P: Noah Austin is a very pleasant 68 year old gentleman with progressive buttock and neuropathic leg pain right worse than the left. Imaging studies demonstrate severe spinal stenosis at L4-5. X-rays show a trace anterior listhesis at L4-5. Despite injection therapy his overall quality of life is continued to deteriorate. Patient is clinical exam is consistent with lumbar spinal stenosis with neurogenic claudication right side worse than the left. Based on his imaging studies and complaints I think it is reasonable to move forward with a L4-5 decompression. Although he does have a slight anterior listhesis I do not believe it is severe enough that a fusion is warranted following the decompression. I have gone over the surgical procedure in great detail with the patient and his sister and all of their questions were  addressed. Risks and benefits of lumbar decompression/discectomy: Infection, bleeding, death, stroke, paralysis, ongoing or worse pain, need for additional surgery, leak of spinal fluid, adjacent segment degeneration requiring additional surgery, post-operative hematoma formation that can result in neurological compromise and the need for urgent/emergent re-operation. Loss in bowel and bladder control. Injury to major vessels that could result in the need for urgent abdominal surgery to stop bleeding. Risk of deep venous thrombosis (DVT) and the need for additional treatment. Recurrent disc herniation resulting in the need for revision surgery, which could include fusion surgery (utilizing instrumentation such as pedicle screws and intervertebral cages).

## 2023-06-14 NOTE — Progress Notes (Signed)
 Pharmacy Antibiotic Note  Noah Austin is a 68 y.o. male admitted on 06/14/2023 with for lumbar surgery.  Pharmacy has been consulted for Vancomycin dosing for surgical prophylaxis.  Pre-op MRSA PCR positive 06/11/23. Vancomycin 1500 mg IV given pre-op at 0630  Plan: Vancomycin 750 mg IV q12h Expect 2 doses will be given prior to anticipated discharge on 06/15/23.  Height: 6\' 1"  (185.4 cm) Weight: 118.8 kg (262 lb) IBW/kg (Calculated) : 79.9  Temp (24hrs), Avg:98.1 F (36.7 C), Min:97.5 F (36.4 C), Max:99.1 F (37.3 C)  Recent Labs  Lab 06/11/23 1102  WBC 6.3  CREATININE 1.17    Estimated Creatinine Clearance: 80.5 mL/min (by C-G formula based on SCr of 1.17 mg/dL).    Allergies  Allergen Reactions   Codeine Itching   Amlodipine Besylate Other (See Comments)    Unsure of Reaction   Hydrochlorothiazide Other (See Comments)    Unsure of Reaction    Antimicrobials this admission: Vancomycin 2/27>> Cefazolin 2 mg IV x 1 on 2/27  Dose adjustments this admission: n/a  Microbiology results: 2/24: MRSA PCR: positive  Thank you for allowing pharmacy to be a part of this patient's care.  Dennie Fetters, RPh 06/14/2023 1:11 PM

## 2023-06-14 NOTE — Addendum Note (Signed)
 Addendum  created 06/14/23 1340 by Darlina Guys, CRNA   Intraprocedure Meds edited

## 2023-06-14 NOTE — Plan of Care (Signed)

## 2023-06-15 ENCOUNTER — Encounter (HOSPITAL_COMMUNITY): Payer: Self-pay | Admitting: Orthopedic Surgery

## 2023-06-15 DIAGNOSIS — G4733 Obstructive sleep apnea (adult) (pediatric): Secondary | ICD-10-CM | POA: Diagnosis not present

## 2023-06-15 DIAGNOSIS — Z79899 Other long term (current) drug therapy: Secondary | ICD-10-CM | POA: Diagnosis not present

## 2023-06-15 DIAGNOSIS — M4316 Spondylolisthesis, lumbar region: Secondary | ICD-10-CM | POA: Diagnosis not present

## 2023-06-15 DIAGNOSIS — M2578 Osteophyte, vertebrae: Secondary | ICD-10-CM | POA: Diagnosis not present

## 2023-06-15 DIAGNOSIS — M5416 Radiculopathy, lumbar region: Secondary | ICD-10-CM | POA: Diagnosis not present

## 2023-06-15 DIAGNOSIS — G4730 Sleep apnea, unspecified: Secondary | ICD-10-CM | POA: Diagnosis not present

## 2023-06-15 DIAGNOSIS — Z7985 Long-term (current) use of injectable non-insulin antidiabetic drugs: Secondary | ICD-10-CM | POA: Diagnosis not present

## 2023-06-15 DIAGNOSIS — M48062 Spinal stenosis, lumbar region with neurogenic claudication: Secondary | ICD-10-CM | POA: Diagnosis not present

## 2023-06-15 DIAGNOSIS — K219 Gastro-esophageal reflux disease without esophagitis: Secondary | ICD-10-CM | POA: Diagnosis not present

## 2023-06-15 DIAGNOSIS — Z7984 Long term (current) use of oral hypoglycemic drugs: Secondary | ICD-10-CM | POA: Diagnosis not present

## 2023-06-15 DIAGNOSIS — E119 Type 2 diabetes mellitus without complications: Secondary | ICD-10-CM | POA: Diagnosis not present

## 2023-06-15 DIAGNOSIS — I10 Essential (primary) hypertension: Secondary | ICD-10-CM | POA: Diagnosis not present

## 2023-06-15 DIAGNOSIS — E1165 Type 2 diabetes mellitus with hyperglycemia: Secondary | ICD-10-CM | POA: Diagnosis not present

## 2023-06-15 DIAGNOSIS — Z87891 Personal history of nicotine dependence: Secondary | ICD-10-CM | POA: Diagnosis not present

## 2023-06-15 DIAGNOSIS — Z8501 Personal history of malignant neoplasm of esophagus: Secondary | ICD-10-CM | POA: Diagnosis not present

## 2023-06-15 LAB — GLUCOSE, CAPILLARY: Glucose-Capillary: 221 mg/dL — ABNORMAL HIGH (ref 70–99)

## 2023-06-15 MED ORDER — MUPIROCIN 2 % EX OINT
1.0000 | TOPICAL_OINTMENT | Freq: Two times a day (BID) | CUTANEOUS | Status: DC
  Administered 2023-06-15: 1 via NASAL
  Filled 2023-06-15: qty 22

## 2023-06-15 MED ORDER — CHLORHEXIDINE GLUCONATE CLOTH 2 % EX PADS
6.0000 | MEDICATED_PAD | Freq: Every day | CUTANEOUS | Status: DC
  Administered 2023-06-15: 6 via TOPICAL

## 2023-06-15 NOTE — Plan of Care (Signed)

## 2023-06-15 NOTE — Discharge Summary (Addendum)
 Patient ID: Noah Austin MRN: [id number] DOB/AGE: 1955/10/05 68 y.o.  Admit date: 06/14/2023 Discharge date: 06/15/2023  Admission Diagnoses:  Principal Problem:   Spinal stenosis   Discharge Diagnoses:  Principal Problem:   Spinal stenosis  status post Procedure(s): LUMBAR DECOMPRESSION LUMBAR FOUR - FIVE  Past Medical History:  Diagnosis Date   Barrett's esophagus    Diabetes mellitus without complication (HCC)    type 2   Dyslipidemia    Hyperlipidemia    Hypertension    Obesity (BMI 35.0-39.9 without comorbidity)    Sleep apnea     Surgeries: Procedure(s): LUMBAR DECOMPRESSION LUMBAR FOUR - FIVE on 06/14/2023   Consultants:   Discharged Condition: Improved  Hospital Course: Noah Austin is an 68 y.o. male who was admitted 06/14/2023 for operative treatment of Spinal stenosis. Patient failed conservative treatments (please see the history and physical for the specifics) and had severe unremitting pain that affects sleep, daily activities and work/hobbies. After pre-op clearance, the patient was taken to the operating room on 06/14/2023 and underwent  Procedure(s): LUMBAR DECOMPRESSION LUMBAR FOUR - FIVE.    Patient was given perioperative antibiotics:  Anti-infectives (From admission, onward)    Start     Dose/Rate Route Frequency Ordered Stop   06/14/23 1800  vancomycin (VANCOREADY) IVPB 750 mg/150 mL        750 mg 150 mL/hr over 60 Minutes Intravenous Every 12 hours 06/14/23 1310 06/15/23 1759   06/14/23 1600  ceFAZolin (ANCEF) IVPB 1 g/50 mL premix        1 g 100 mL/hr over 30 Minutes Intravenous Every 8 hours 06/14/23 1232 06/15/23 0017   06/14/23 0700  ceFAZolin (ANCEF) IVPB 3g/150 mL premix        3 g 300 mL/hr over 30 Minutes Intravenous 30 min pre-op 06/14/23 0552 06/14/23 0815   06/14/23 0700  vancomycin (VANCOREADY) IVPB 1500 mg/300 mL        1,500 mg 150 mL/hr over 120 Minutes Intravenous 60 min pre-op 06/14/23 0552 06/14/23 0830         Patient was given sequential compression devices and early ambulation to prevent DVT.   Patient benefited maximally from hospital stay and there were no complications. At the time of discharge, the patient was urinating/moving their bowels without difficulty, tolerating a regular diet, pain is controlled with oral pain medications and they have been cleared by PT/OT.   Recent vital signs: Patient Vitals for the past 24 hrs:  BP Temp Temp src Pulse Resp SpO2  06/15/23 0452 109/64 98.2 F (36.8 C) Oral 87 20 93 %  06/14/23 2042 110/62 98.5 F (36.9 C) Oral (!) 47 20 93 %  06/14/23 1632 126/77 98 F (36.7 C) Oral 98 18 97 %  06/14/23 1254 (!) 102/50 (!) 97.5 F (36.4 C) Oral 85 20 96 %  06/14/23 1200 (!) 102/43 -- -- 88 15 98 %  06/14/23 1130 (!) 110/45 98 F (36.7 C) -- 88 19 97 %  06/14/23 1115 103/77 -- -- 91 17 92 %  06/14/23 1100 107/69 -- -- 95 14 95 %  06/14/23 1045 (!) 98/56 99.1 F (37.3 C) -- 88 14 94 %     Recent laboratory studies: No results for input(s): "WBC", "HGB", "HCT", "PLT", "NA", "K", "CL", "CO2", "BUN", "CREATININE", "GLUCOSE", "INR", "CALCIUM" in the last 72 hours.  Invalid input(s): "PT", "2"   Discharge Medications:   Allergies as of 06/15/2023       Reactions   Codeine  Itching   Amlodipine Besylate Other (See Comments)   Unsure of Reaction   Hydrochlorothiazide Other (See Comments)   Unsure of Reaction        Medication List     STOP taking these medications    aspirin 81 MG tablet   Fish Oil 1000 MG Caps   multivitamin with minerals tablet   Ozempic (1 MG/DOSE) 4 MG/3ML Sopn Generic drug: Semaglutide (1 MG/DOSE)   tadalafil 5 MG tablet Commonly known as: CIALIS       TAKE these medications    Dexcom G7 Sensor Misc 1 Device by Does not apply route continuous.   furosemide 20 MG tablet Commonly known as: LASIX Take 20 mg by mouth daily.   indapamide 2.5 MG tablet Commonly known as: LOZOL Take 2.5 mg by mouth every  morning.   Jardiance 25 MG Tabs tablet Generic drug: empagliflozin Take 25 mg by mouth daily.   lisinopril 20 MG tablet Commonly known as: ZESTRIL Take 20 mg by mouth daily.   metFORMIN 500 MG 24 hr tablet Commonly known as: GLUCOPHAGE-XR Take 1,000 mg by mouth daily with breakfast.   methocarbamol 500 MG tablet Commonly known as: ROBAXIN Take 1 tablet (500 mg total) by mouth every 8 (eight) hours as needed for up to 5 days for muscle spasms.   metoprolol succinate 50 MG 24 hr tablet Commonly known as: TOPROL-XL TAKE 1 TABLET BY MOUTH EVERY  MORNING WITH OR IMMEDIATELY  FOLLOWING A MEAL What changed: See the new instructions.   ondansetron 4 MG tablet Commonly known as: Zofran Take 1 tablet (4 mg total) by mouth every 8 (eight) hours as needed for nausea or vomiting.   OneTouch Delica Plus Lancet33G Misc SMARTSIG:1 Topical Daily   OneTouch Verio test strip Generic drug: glucose blood 1 each daily.   Osteo Bi-Flex Adv Triple St Tabs Take 2 tablets by mouth daily.   oxyCODONE-acetaminophen 10-325 MG tablet Commonly known as: Percocet Take 1 tablet by mouth every 6 (six) hours as needed for up to 5 days for pain.   pantoprazole 20 MG tablet Commonly known as: PROTONIX Take 20 mg by mouth daily.   potassium chloride SA 20 MEQ tablet Commonly known as: KLOR-CON M Take 20 mEq by mouth daily.   pravastatin 40 MG tablet Commonly known as: PRAVACHOL Take 40 mg by mouth at bedtime.   zolpidem 5 MG tablet Commonly known as: AMBIEN Take 5 mg by mouth at bedtime.        Diagnostic Studies: LONG TERM MONITOR (3-14 DAYS) Result Date: 06/14/2023 Cardiac monitor (Zio Patch): May 29, 2023-June 06, 2023 Dominant rhythm sinus, tachycardia burden (24%). Heart rate 61-185 bpm.  Avg HR 94 bpm. No atrial fibrillation detected during the monitoring period. No ventricular tachycardia, high grade AV block, pauses (3 seconds or longer). Total supraventricular ectopic burden  4.7%. Fastest episode of PSVT, 6 beats, max HR 185 bpm, average HR 164 bpm. Longest episode of PSVT, 67 beats, max HR 128 bpm, average HR 112 bpm, findings suggestive of AVNRT. Total ventricular ectopic burden <1%. Patient triggered events: 0.   DG Lumbar Spine 2-3 Views Result Date: 06/14/2023 CLINICAL DATA:  68 year old male undergoing lumbar surgery. EXAM: LUMBAR SPINE - 2-3 VIEW COMPARISON:  CT Abdomen and Pelvis 06/03/2020. FINDINGS: Normal lumbar segmentation with vestigial S1-S2 disc space on the CT comparison, lowest ribs at T12. Intraoperative portable cross-table lateral view of the lumbar spine 0817 hours. Posterior needles or surgical probes are directed toward the inferior L4  and mid L5 vertebral levels. Intraoperative portable cross-table lateral view #2 of the lumbar spine at 0830 hours. Surgical probe posterior to the L4-L5 disc space. Mild grade 1 spondylolisthesis redemonstrated there. IMPRESSION: Intraoperative localization at L4-L5 on image #2 at 0830 hours. This was discussed by operating room personnel on 06/14/2023 at 08:51 . Electronically Signed   By: Odessa Fleming M.D.   On: 06/14/2023 08:52       Follow-up Information     Venita Lick, MD. Schedule an appointment as soon as possible for a visit in 2 week(s).   Specialty: Orthopedic Surgery Why: If symptoms worsen, For suture removal, For wound re-check Contact information: [address] [place] [address] [phone]                 Discharge Plan:  discharge to home today pending PT clearance  Disposition:  Noah Austin is a very pleasant 68 year old male who is POD 1 from a Lumbar Decompression L4-L5 preformed by Dr. Shon Baton. The surgery overall went well without complications. The patients hospital stay has been without complications. At this time the patient states that his pre-operative radicular leg pain has significantly improved. He states that he has been voiding, has been tolerating oral and  liquid intake, has had positive flatus, and has been ambulating. He states that his pain is well controlled. Physical exam is unremarkable and dressing is C/D/I.  The patient did state that he did notice a new bruise on the anterior aspect of the right lower extremity. This could have occurred through bed positioning or transferring during surgery or at another time during the hospital stay; the skin is intact and is not erythematous or edematous. Educated the patient that he may shower 5 days post-op and that his dressing will remain until he has his first post-op appointment in 2 weeks. Patient will work wit PT today and is pending D/C to home later this afternoon.   Signed: Luther Bradley for Dr. Venita Lick [facility] [phone] 06/15/2023, 6:41 AM

## 2023-06-15 NOTE — Evaluation (Signed)
 Physical Therapy Evaluation Patient Details Name: Noah Austin MRN: [id number] DOB: 1955/12/10 Today's Date: 06/15/2023  History of Present Illness  Pt is a 68 y.o. male s/p L4-5 decompression 2/27. PMH: obesity, DMII, HTN  Clinical Impression  PT eval complete. Pt mod I bed mobility. Supervision transfers and ambulation 400' without AD. Mild unsteadiness. Pt has RW at home and advised to use initially if unsteadiness persists. Ascend/descent 12 steps with L rail CGA. All education complete. Plan is for d/c home today. PT signing off.         If plan is discharge home, recommend the following: Assist for transportation;Assistance with cooking/housework;Help with stairs or ramp for entrance   Can travel by private vehicle        Equipment Recommendations None recommended by PT  Recommendations for Other Services       Functional Status Assessment Patient has had a recent decline in their functional status and demonstrates the ability to make significant improvements in function in a reasonable and predictable amount of time.     Precautions / Restrictions Precautions Precautions: Back Recall of Precautions/Restrictions: Intact Precaution/Restrictions Comments: Educated on 3/3 back precautions. Handout provided. Required Braces or Orthoses: Spinal Brace Spinal Brace: Lumbar corset;Applied in standing position      Mobility  Bed Mobility               General bed mobility comments: increased time, HOB elevated    Transfers Overall transfer level: Needs assistance Equipment used: None Transfers: Sit to/from Stand Sit to Stand: Supervision           General transfer comment: increased time to power up    Ambulation/Gait Ambulation/Gait assistance: Supervision Gait Distance (Feet): 400 Feet Assistive device: None Gait Pattern/deviations: Step-through pattern, Decreased stride length, Wide base of support Gait velocity: WNL, cues needed to slow down      General Gait Details: mild waddle gait pattern with mild instability. Pt has RW at home if needed.  Stairs Stairs: Yes Stairs assistance: Contact guard assist Stair Management: One rail Left, Alternating pattern, Step to pattern, Forwards Number of Stairs: 12 General stair comments: alternating pattern with ascent, step to with descent  Wheelchair Mobility     Tilt Bed    Modified Rankin (Stroke Patients Only)       Balance Overall balance assessment: Mild deficits observed, not formally tested                                           Pertinent Vitals/Pain Pain Assessment Pain Assessment: 0-10 Pain Score: 6  Pain Location: back Pain Descriptors / Indicators: Grimacing, Operative site guarding, Sore Pain Intervention(s): Monitored during session, Repositioned    Home Living Family/patient expects to be discharged to:: Private residence Living Arrangements: Alone Available Help at Discharge: Family;Available PRN/intermittently (Sister plans to stay with pt first 24 hours. Family will then check on daily.) Type of Home: House Home Access: Stairs to enter Entrance Stairs-Rails: Left Entrance Stairs-Number of Steps: 14 steps through basement garage or 1 step through back door/porch.   Home Layout: One level Home Equipment: Agricultural consultant (2 wheels);Wheelchair - manual;Other (comment) (adjustable frame bed, lift chair)      Prior Function Prior Level of Function : Independent/Modified Independent;Driving                     Extremity/Trunk Assessment  Upper Extremity Assessment Upper Extremity Assessment: Overall WFL for tasks assessed    Lower Extremity Assessment Lower Extremity Assessment: Overall WFL for tasks assessed    Cervical / Trunk Assessment Cervical / Trunk Assessment: Back Surgery  Communication   Communication Communication: No apparent difficulties    Cognition Arousal: Alert Behavior During Therapy: WFL for  tasks assessed/performed   PT - Cognitive impairments: No apparent impairments                         Following commands: Intact       Cueing       General Comments      Exercises     Assessment/Plan    PT Assessment Patient does not need any further PT services  PT Problem List         PT Treatment Interventions      PT Goals (Current goals can be found in the Care Plan section)  Acute Rehab PT Goals Patient Stated Goal: home today PT Goal Formulation: All assessment and education complete, DC therapy    Frequency       Co-evaluation               AM-PAC PT "6 Clicks" Mobility  Outcome Measure Help needed turning from your back to your side while in a flat bed without using bedrails?: None Help needed moving from lying on your back to sitting on the side of a flat bed without using bedrails?: None Help needed moving to and from a bed to a chair (including a wheelchair)?: A Little Help needed standing up from a chair using your arms (e.g., wheelchair or bedside chair)?: A Little Help needed to walk in hospital room?: A Little Help needed climbing 3-5 steps with a railing? : A Little 6 Click Score: 20    End of Session Equipment Utilized During Treatment: Gait belt;Back brace Activity Tolerance: Patient tolerated treatment well Patient left: in bed;with call bell/phone within reach;with family/visitor present Nurse Communication: Mobility status PT Visit Diagnosis: Difficulty in walking, not elsewhere classified (R26.2)    Time: 1610-9604 PT Time Calculation (min) (ACUTE ONLY): 27 min   Charges:   PT Evaluation $PT Eval Moderate Complexity: 1 Mod   PT General Charges $$ ACUTE PT VISIT: 1 Visit         Ferd Glassing., PT  Office # [phone]   Ilda Foil 06/15/2023, 8:08 AM

## 2023-06-15 NOTE — Progress Notes (Signed)
 Patient in no acute distress nor complaints of pain nor discomfort; incision on back is clean, dry and intact; No c/o pain at this time. Room was checked and accounted for all patient's belongings; discharge instructions concerning her medications, incision care, follow up appointment and when to call the doctor as needed were all discussed with patient by RN and he expressed understanding on the instructions given.

## 2023-06-15 NOTE — Evaluation (Signed)
 Occupational Therapy Evaluation Patient Details Name: Noah Austin MRN: [id number] DOB: 1956-04-03 Today's Date: 06/15/2023   History of Present Illness   Pt is a 68 y.o. male s/p L4-5 decompression 2/27. PMH: obesity, DMII, HTN     Clinical Impressions Patient admitted for the procedure above.  PTA he lives alone, and remained independent.  Currently he is very close to baseline, needing only Min A for socks and shoes due to back discomfort.  Patient with good understanding of precautions, and no further OT needs in the acute setting.  Recommend follow up as prescribed by MD.       If plan is discharge home, recommend the following:   Assist for transportation     Functional Status Assessment   Patient has had a recent decline in their functional status and demonstrates the ability to make significant improvements in function in a reasonable and predictable amount of time.     Equipment Recommendations   None recommended by OT     Recommendations for Other Services         Precautions/Restrictions   Precautions Precautions: Back Recall of Precautions/Restrictions: Intact Required Braces or Orthoses: Spinal Brace Spinal Brace: Lumbar corset Restrictions Weight Bearing Restrictions Per Provider Order: No     Mobility Bed Mobility Overal bed mobility: Modified Independent             General bed mobility comments: increased time, HOB elevated    Transfers Overall transfer level: Modified independent                        Balance Overall balance assessment: Mild deficits observed, not formally tested                                         ADL either performed or assessed with clinical judgement   ADL Overall ADL's : At baseline                                             Vision Patient Visual Report: No change from baseline       Perception Perception: Not tested       Praxis Praxis:  Not tested       Pertinent Vitals/Pain Pain Assessment Pain Assessment: Faces Faces Pain Scale: Hurts little more Pain Location: back Pain Descriptors / Indicators: Grimacing, Operative site guarding, Sore Pain Intervention(s): Premedicated before session     Extremity/Trunk Assessment Upper Extremity Assessment Upper Extremity Assessment: Overall WFL for tasks assessed   Lower Extremity Assessment Lower Extremity Assessment: Defer to PT evaluation   Cervical / Trunk Assessment Cervical / Trunk Assessment: Back Surgery   Communication Communication Communication: No apparent difficulties   Cognition Arousal: Alert Behavior During Therapy: WFL for tasks assessed/performed                                 Following commands: Intact       Cueing  General Comments       VSS on RA   Exercises     Shoulder Instructions      Home Living Family/patient expects to be discharged to:: Private residence Living Arrangements: Alone Available Help at Discharge: Family;Available  PRN/intermittently Type of Home: House Home Access: Stairs to enter Entrance Stairs-Number of Steps: 14 steps through basement garage or 1 step through back door/porch. Entrance Stairs-Rails: Left Home Layout: One level     Bathroom Shower/Tub: Tub/shower unit;Walk-in shower   Bathroom Toilet: Standard Bathroom Accessibility: Yes How Accessible: Accessible via walker Home Equipment: Rolling Walker (2 wheels);Wheelchair - Careers adviser (comment)          Prior Functioning/Environment Prior Level of Function : Independent/Modified Independent;Driving                    OT Problem List: Pain   OT Treatment/Interventions:        OT Goals(Current goals can be found in the care plan section)   Acute Rehab OT Goals Patient Stated Goal: Return home OT Goal Formulation: With patient Time For Goal Achievement: 06/18/23 Potential to Achieve Goals: Good   OT  Frequency:       Co-evaluation              AM-PAC OT "6 Clicks" Daily Activity     Outcome Measure Help from another person eating meals?: None Help from another person taking care of personal grooming?: None Help from another person toileting, which includes using toliet, bedpan, or urinal?: None Help from another person bathing (including washing, rinsing, drying)?: None Help from another person to put on and taking off regular upper body clothing?: None Help from another person to put on and taking off regular lower body clothing?: A Little 6 Click Score: 23   End of Session Equipment Utilized During Treatment: Back brace Nurse Communication: Mobility status  Activity Tolerance: Patient tolerated treatment well Patient left: in chair;with call bell/phone within reach  OT Visit Diagnosis: Unsteadiness on feet (R26.81)                Time: 9147-8295 OT Time Calculation (min): 17 min Charges:  OT General Charges $OT Visit: 1 Visit OT Evaluation $OT Eval Moderate Complexity: 1 Mod  06/15/2023  RP, OTR/L  Acute Rehabilitation Services  Office:  [phone]   Suzanna Obey 06/15/2023, 8:26 AM

## 2023-06-18 ENCOUNTER — Ambulatory Visit: Payer: Medicare Other | Admitting: Cardiology

## 2023-06-25 ENCOUNTER — Ambulatory Visit: Admitting: Cardiology

## 2023-07-02 ENCOUNTER — Other Ambulatory Visit: Payer: Self-pay

## 2023-07-02 ENCOUNTER — Ambulatory Visit: Attending: Cardiology | Admitting: Cardiology

## 2023-07-02 ENCOUNTER — Telehealth: Payer: Self-pay | Admitting: Cardiology

## 2023-07-02 ENCOUNTER — Encounter: Payer: Self-pay | Admitting: Cardiology

## 2023-07-02 VITALS — BP 114/80 | HR 87 | Resp 16 | Ht 73.0 in | Wt 264.0 lb

## 2023-07-02 DIAGNOSIS — E782 Mixed hyperlipidemia: Secondary | ICD-10-CM | POA: Diagnosis not present

## 2023-07-02 DIAGNOSIS — I10 Essential (primary) hypertension: Secondary | ICD-10-CM

## 2023-07-02 DIAGNOSIS — I491 Atrial premature depolarization: Secondary | ICD-10-CM

## 2023-07-02 DIAGNOSIS — G4730 Sleep apnea, unspecified: Secondary | ICD-10-CM

## 2023-07-02 DIAGNOSIS — I493 Ventricular premature depolarization: Secondary | ICD-10-CM

## 2023-07-02 DIAGNOSIS — I4510 Unspecified right bundle-branch block: Secondary | ICD-10-CM

## 2023-07-02 DIAGNOSIS — E119 Type 2 diabetes mellitus without complications: Secondary | ICD-10-CM

## 2023-07-02 MED ORDER — METOPROLOL SUCCINATE ER 100 MG PO TB24: 100.0000 mg | ORAL_TABLET | Freq: Every morning | ORAL | 3 refills | Status: DC

## 2023-07-02 MED ORDER — METOPROLOL SUCCINATE ER 50 MG PO TB24: 100.0000 mg | ORAL_TABLET | Freq: Every morning | ORAL | 3 refills | Status: DC

## 2023-07-02 MED ORDER — METOPROLOL SUCCINATE ER 50 MG PO TB24: 50.0000 mg | ORAL_TABLET | Freq: Every morning | ORAL | 3 refills | Status: DC

## 2023-07-02 NOTE — Progress Notes (Signed)
 Cardiology Office Note:  .   Date:  07/02/2023  ID:  Noah Austin, DOB 03-Dec-1955, MRN [id number] PCP:  Noberto Retort, MD  Former Cardiology Providers: Dr. Clotilde Dieter [facility] Providers Cardiologist:  Tessa Lerner, DO , [facility] (established care 12/19/2022) Electrophysiologist:  None  Click to update primary MD,subspecialty MD or APP then REFRESH:1}    Chief Complaint  Patient presents with   premature atrial contraction   Follow-up    History of Present Illness: [name]   Noah Austin is a 68 y.o. Caucasian male whose past medical history and cardiovascular risk factors includes: Hypertension, hyperlipidemia, history of frequent SVE, NIDDM TypeII, obesity.  Patient being followed by the practice given his elevated SVE burden.  In the past patient was experiencing palpitations and had a Zio patch which noted a supraventricular ectopic burden of 16.7%.  He was started on beta-blocker therapy and has done well.  This is his 60-month follow-up visit.  At last office visit the shared decision was to proceed with a repeat Zio patch to reevaluate his SVE burden.  Cardiac monitor results reviewed in detail with the patient and his sister who was present at today's office visit.  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  And he has a tachycardia burden of approximately 24%.  Clinically patient states that he feels tired, fatigued, just not getting enough sleep.  He has prior diagnosis of sleep apnea but chooses not to be on a CPAP (in the past).  However patient is considering being reevaluated and addressing his sleep apnea.  Family history is significant for atrial fibrillation in both mother and brother.  Review of Systems: .   Review of Systems  Cardiovascular:  Negative for chest pain, claudication, irregular heartbeat, leg swelling, near-syncope, orthopnea, palpitations, paroxysmal nocturnal dyspnea and  syncope.  Respiratory:  Negative for shortness of breath.   Hematologic/Lymphatic: Negative for bleeding problem.    Studies Reviewed:   EKG: EKG Interpretation Date/Time:  Monday July 02 2023 09:17:25 EDT Ventricular Rate:  89 PR Interval:  164 QRS Duration:  150 QT Interval:  438 QTC Calculation: 532 R Axis:   -35  Text Interpretation: Sinus rhythm with occasional Premature ventricular complexes and Premature atrial complexes Left axis deviation Right bundle branch block When compared with ECG of 03-Jun-2020 01:08, Premature atrial complexes Premature ventricular complexes are new compared to prior tracing. Confirmed by Tessa Lerner [phone]) on 07/02/2023 9:24:17 AM  Echocardiogram: 05/04/2022: LVEF 60 to 65%, grade 1 diastolic dysfunction, mild to moderate LAE dilatation, see report  Cardiac monitor: January 2024: Average heart rate 91 bpm, SVE burden 16.7%, VE burden 1.5%, no detected atrial fibrillation during the monitoring period.  Cardiac monitor (Zio Patch): May 29, 2023-June 06, 2023 Dominant rhythm sinus, tachycardia burden (24%). Heart rate 61-185 bpm.  Avg HR 94 bpm. No atrial fibrillation detected during the monitoring period. No ventricular tachycardia, high grade AV block, pauses (3 seconds or longer). Total supraventricular ectopic burden 4.7%. Fastest episode of PSVT, 6 beats, max HR 185 bpm, average HR 164 bpm. Longest episode of PSVT, 67 beats, max HR 128 bpm, average HR 112 bpm, findings suggestive of AVNRT. Total ventricular ectopic burden <1%. Patient triggered events: 0.  RADIOLOGY: NA  Risk Assessment/Calculations:   NA   Labs:       Latest Ref Rng & Units 06/11/2023   11:02 AM 06/03/2020    1:14 AM 07/03/2018  8:34 PM  CBC  WBC 4.0 - 10.5 K/uL 6.3  5.9  7.1   Hemoglobin 13.0 - 17.0 g/dL 16.1  09.6  04.5   Hematocrit 39.0 - 52.0 % 49.3  44.3  43.5   Platelets 150 - 400 K/uL 214  205  222        Latest Ref Rng & Units 06/11/2023    11:02 AM 01/22/2023    9:39 AM 06/03/2020    1:14 AM  BMP  Glucose 70 - 99 mg/dL 409  811  914   BUN 8 - 23 mg/dL 21  19  22    Creatinine 0.61 - 1.24 mg/dL 7.82  9.56  2.13   Sodium 135 - 145 mmol/L 135  135  140   Potassium 3.5 - 5.1 mmol/L 3.2  3.0  3.5   Chloride 98 - 111 mmol/L 93  95  104   CO2 22 - 32 mmol/L 29  28  26    Calcium 8.9 - 10.3 mg/dL 08.6  9.4  9.0       Latest Ref Rng & Units 06/11/2023   11:02 AM 01/22/2023    9:39 AM 06/03/2020    4:05 AM  CMP  Glucose 70 - 99 mg/dL 578  469    BUN 8 - 23 mg/dL 21  19    Creatinine 6.29 - 1.24 mg/dL 5.28  4.13    Sodium 244 - 145 mmol/L 135  135    Potassium 3.5 - 5.1 mmol/L 3.2  3.0    Chloride 98 - 111 mmol/L 93  95    CO2 22 - 32 mmol/L 29  28    Calcium 8.9 - 10.3 mg/dL 01.0  9.4    Total Protein 6.0 - 8.3 g/dL  6.5  6.3   Total Bilirubin 0.2 - 1.2 mg/dL  1.4  1.0   Alkaline Phos 39 - 117 U/L  55  50   AST 0 - 37 U/L  24  27   ALT 0 - 53 U/L  28  29     Lab Results  Component Value Date   CHOL 159 01/22/2023   HDL 44.70 01/22/2023   LDLCALC 35 01/22/2023   TRIG 393.0 (H) 01/22/2023   CHOLHDL 4 01/22/2023   No results for input(s): "LIPOA" in the last 8760 hours. No components found for: "NTPROBNP" No results for input(s): "PROBNP" in the last 8760 hours. No results for input(s): "TSH" in the last 8760 hours.  Physical Exam:    Today's Vitals   07/02/23 0914  BP: 114/80  Pulse: 87  Resp: 16  SpO2: 96%  Weight: 264 lb (119.7 kg)  Height: 6\' 1"  (1.854 m)   Body mass index is 34.83 kg/m. Wt Readings from Last 3 Encounters:  07/02/23 264 lb (119.7 kg)  06/14/23 262 lb (118.8 kg)  06/11/23 264 lb 14.4 oz (120.2 kg)    Physical Exam  Constitutional: No distress.  Age appropriate, hemodynamically stable.   Neck: No JVD present.  Cardiovascular: Normal rate, regular rhythm, S1 normal and S2 normal. Exam reveals no gallop, no S3 and no S4.  No murmur heard. Pulmonary/Chest: Effort normal and breath  sounds normal. No stridor. He has no wheezes. He has no rales.  Abdominal: Soft. Bowel sounds are normal. He exhibits no distension. There is no abdominal tenderness.  Musculoskeletal:        General: No edema.     Cervical back: Neck supple.  Neurological: He is alert and  oriented to person, place, and time. He has intact cranial nerves (2-12).  Skin: Skin is warm and moist.     Impression & Recommendation(s):  Impression:   ICD-10-CM   1. PAC (premature atrial contraction)  I49.1 EKG 12-Lead    DISCONTINUED: metoprolol succinate (TOPROL-XL) 100 MG 24 hr tablet    2. Premature ventricular beat  I49.3     3. RBBB (right bundle branch block)  I45.10     4. Sleep apnea in adult  G47.30 Ambulatory referral to Sleep Studies    5. Essential hypertension  I10     6. Type 2 diabetes mellitus without complication, without long-term current use of insulin (HCC)  E11.9     7. Mixed hyperlipidemia  E78.2        Recommendation(s):  PAC (premature atrial contraction) Premature ventricular beat RBBB (right bundle branch block) Chronic, progressive. Repeat cardiac monitor illustrates average heart rate of 94 bpm, tachycardia burden of 24%, SVE burden has improved from 16.7% to 4.7% EKG today illustrates sinus rhythm with PVCs. His average heart rate on a recent cardiac monitor was 94 bpm, tachycardia burden is 24%, prior echocardiogram also illustrated dilated left atrium, given his obesity and untreated sleep apnea my concern is that the degree of ectopy is predisposing him to onset of atrial fibrillation. I had a long discussion with the patient and sister with regards to up titration of medical therapy. We discussed transitioning beta-blockers to a higher dose or changing it altogether to calcium channel blockers. Patient participated in the shared decision-making process and the consensus was to uptitrate Toprol-XL. During the office visit patient informed me that he is on Toprol-XL 50  mg p.o. daily and the shared decision was to go up to 100 mg p.o. daily. However, prior to the completion of the note and before the end of business day he reached out and stated that he is only taking Toprol-XL 25 mg p.o. daily.  Keeping this in mind we will increase his Toprol-XL to 50 mg p.o. daily and reevaluate. Patient is agreeable with being reconsidered/evaluated for sleep apnea and is motivated to address it. Drug profile discussed with the patient and sister.  Sleep apnea in adult Inconsistent CPAP use exacerbates cardiac issues. Untreated sleep apnea contributes to dilated left atrium and AFib risk. In the past did not want to address his sleep apnea despite having a CPAP at home. However given the clinical trajectory as well as feeling tired, fatigued, sleep deprived patient is willing to have this readdressed. Will refer her to Dr. Mayford Knife for sleep apnea evaluation He likely will need to have a repeat study. Patient is advised not to drive if feeling drowsy  Essential hypertension Office blood pressures are very well-controlled. Continue Jardiance 25 mg p.o. daily. Continue Lasix 20 mg p.o. daily. Continue lisinopril 20 mg p.o. daily. Continue metoprolol as discussed above.  Type 2 diabetes mellitus without complication, without long-term current use of insulin (HCC) Reemphasized importance of glycemic control. Currently on Jardiance, ACE inhibitors, statin therapy Reemphasized importance of weight loss and dressing his modifiable cardiovascular risk factors.  Mixed hyperlipidemia Currently on pravastatin 40 mg p.o. daily.   He denies myalgia or other side effects. Most recent lipids dated 05/29/2023, independently reviewed as noted above.  LDL is 65 mg/dL. Triglycerides are not at goal, reported to be 370 mg/dL, patient states that he is focusing on lifestyle changes for now Cardiology is following peripherally.    Orders Placed:  Orders Placed This Encounter  Procedures   Ambulatory referral to Sleep Studies    Referral Priority:   Routine    Referral Type:   Consultation    Referral Reason:   Specialty Services Required    Referred to Provider:   Quintella Reichert, MD    Number of Visits Requested:   1   EKG 12-Lead     Final Medication List:    Meds ordered this encounter  Medications   DISCONTD: metoprolol succinate (TOPROL-XL) 100 MG 24 hr tablet    Sig: Take 1 tablet (100 mg total) by mouth in the morning. TAKE 1 TABLET BY MOUTH EVERY  MORNING WITH OR IMMEDIATELY  FOLLOWING A MEAL    Dispense:  90 tablet    Refill:  3    Increasing dose to 100 mg    Medications Discontinued During This Encounter  Medication Reason   metoprolol succinate (TOPROL-XL) 50 MG 24 hr tablet      Current Outpatient Medications:    Continuous Glucose Sensor (DEXCOM G7 SENSOR) MISC, 1 Device by Does not apply route continuous., Disp: 9 each, Rfl: 0   empagliflozin (JARDIANCE) 25 MG TABS tablet, Take 25 mg by mouth daily., Disp: , Rfl:    furosemide (LASIX) 20 MG tablet, Take 20 mg by mouth daily., Disp: , Rfl:    indapamide (LOZOL) 2.5 MG tablet, Take 2.5 mg by mouth every morning., Disp: , Rfl:    Lancets (ONETOUCH DELICA PLUS LANCET33G) MISC, SMARTSIG:1 Topical Daily, Disp: , Rfl:    lisinopril (ZESTRIL) 20 MG tablet, Take 20 mg by mouth daily., Disp: , Rfl:    metFORMIN (GLUCOPHAGE-XR) 500 MG 24 hr tablet, Take 1,000 mg by mouth daily with breakfast., Disp: , Rfl:    methocarbamol (ROBAXIN) 500 MG tablet, Take 500 mg by mouth every 8 (eight) hours as needed., Disp: , Rfl:    Misc Natural Products (OSTEO BI-FLEX ADV TRIPLE ST) TABS, Take 2 tablets by mouth daily., Disp: , Rfl:    ondansetron (ZOFRAN) 4 MG tablet, Take 1 tablet (4 mg total) by mouth every 8 (eight) hours as needed for nausea or vomiting., Disp: 20 tablet, Rfl: 0   ONETOUCH VERIO test strip, 1 each daily., Disp: , Rfl:    oxyCODONE-acetaminophen (PERCOCET) 10-325 MG tablet, Take 1 tablet  by mouth 3 (three) times daily as needed., Disp: , Rfl:    pantoprazole (PROTONIX) 20 MG tablet, Take 20 mg by mouth daily., Disp: , Rfl:    potassium chloride SA (KLOR-CON M) 20 MEQ tablet, Take 20 mEq by mouth daily., Disp: , Rfl:    pravastatin (PRAVACHOL) 40 MG tablet, Take 40 mg by mouth at bedtime. , Disp: , Rfl:    zolpidem (AMBIEN) 5 MG tablet, Take 5 mg by mouth at bedtime., Disp: , Rfl:    metoprolol succinate (TOPROL-XL) 50 MG 24 hr tablet, Take 1 tablet (50 mg total) by mouth in the morning. TAKE 1 TABLET BY MOUTH EVERY  MORNING WITH OR IMMEDIATELY  FOLLOWING A MEAL, Disp: 90 tablet, Rfl: 3  Consent:   NA  Disposition:   6 months sooner if needed.  His questions and concerns were addressed to his satisfaction. He voices understanding of the recommendations provided during this encounter.    Signed, Tessa Lerner, DO, [facility]  [facility]  [address] [place], [place] [address] 07/02/2023 5:53 PM

## 2023-07-02 NOTE — Patient Instructions (Addendum)
 Medication Instructions:  Your physician has recommended you make the following change in your medication:   INCREASE Metoprolol Succinate (Toprol-XL) to 50 mg once daily   *If you need a refill on your cardiac medications before your next appointment, please call your pharmacy*  Lab Work: None ordered today. If you have labs (blood work) drawn today and your tests are completely normal, you will receive your results only by: MyChart Message (if you have MyChart) OR A paper copy in the mail If you have any lab test that is abnormal or we need to change your treatment, we will call you to review the results.  Testing/Procedures: None ordered today.  Follow-Up: At [facility], you and your health needs are our priority.  As part of our continuing mission to provide you with exceptional heart care, we have created designated Provider Care Teams.  These Care Teams include your primary Cardiologist (physician) and Advanced Practice Providers (APPs -  Physician Assistants and Nurse Practitioners) who all work together to provide you with the care you need, when you need it.  We recommend signing up for the patient portal called "MyChart".  Sign up information is provided on this After Visit Summary.  MyChart is used to connect with patients for Virtual Visits (Telemedicine).  Patients are able to view lab/test results, encounter notes, upcoming appointments, etc.  Non-urgent messages can be sent to your provider as well.   To learn more about what you can do with MyChart, go to [email or web address].    Your next appointment:   6 month(s)  The format for your next appointment:   In Person  Provider:   Tessa Lerner, DO {  Other Instructions You have been referred to Dr. Mayford Knife for re-evaluation of sleep apnea.

## 2023-07-02 NOTE — Telephone Encounter (Signed)
 Spoke to pt's sister on the phone and explained the prescription has been updated and will be 50 mg once daily. Pt's sister verbalized understanding and had no further questions at this time.

## 2023-07-02 NOTE — Telephone Encounter (Signed)
 Pt c/o medication issue:  1. Name of Medication: metoprolol succinate (TOPROL-XL) 100 MG 24 hr tablet   2. How are you currently taking this medication (dosage and times per day)? 25mg  qd  3. Are you having a reaction (difficulty breathing--STAT)? No  4. What is your medication issue? Per pt sister, pt is taking 25mg  every day instead of the 50mg  thought, requesting cb to discuss due to increasing to 100mg  at ov today

## 2023-07-02 NOTE — Progress Notes (Signed)
 Updated rx sent to pharmacy. Pt is aware.

## 2023-07-02 NOTE — Telephone Encounter (Signed)
 Sister is calling in to report to Dr. Odis Hollingshead and RN that both her and the pt were mistaken about the dose of Toprol XL he was previously taking.    Sister states the pt saw Dr. Odis Hollingshead this morning and reported that he thought he was taking Toprol XL 50 mg po daily, but he really was previously taking Toprol XL 25 mg po daily.    Per the pts sister, Dr. Odis Hollingshead instructed him at the OV to increase his Toprol XL to 100 mg po daily, for he was under the impression the pt was previously taking 50 mg daily.  Sister states being he was previously taking Toprol XL 25 mg vs the 50 mg dose, does Dr. Odis Hollingshead still want to increase this medication now to 100 mg daily or should he advise for the pt to increase it from 25 mg po daily to 50 mg po daily?  Will send this message to Dr. Odis Hollingshead and his RN for further advisement and follow-up with the pt/sister.  Sister verbalized understanding and agrees with this plan.

## 2023-07-03 NOTE — Telephone Encounter (Signed)
 TY

## 2023-07-10 ENCOUNTER — Telehealth: Payer: Self-pay | Admitting: Cardiology

## 2023-07-10 NOTE — Telephone Encounter (Signed)
**Note De-Identified Tishanna Dunford Obfuscation** This pts sister and DPR, Geroge Baseman is requesting a call back from Dr Norris Cross scheduler concerning scheduling the pts referral to see Dr Mayford Knife. Please call her at [phone].  I am forwarding this call to Dr Norris Cross scheduler.

## 2023-07-10 NOTE — Telephone Encounter (Signed)
 Calling about schedule the patient sleep study. Please advise

## 2023-07-16 DIAGNOSIS — E1165 Type 2 diabetes mellitus with hyperglycemia: Secondary | ICD-10-CM | POA: Diagnosis not present

## 2023-08-06 DIAGNOSIS — G8929 Other chronic pain: Secondary | ICD-10-CM | POA: Diagnosis not present

## 2023-08-06 DIAGNOSIS — M545 Low back pain, unspecified: Secondary | ICD-10-CM | POA: Diagnosis not present

## 2023-08-15 DIAGNOSIS — E1165 Type 2 diabetes mellitus with hyperglycemia: Secondary | ICD-10-CM | POA: Diagnosis not present

## 2023-08-16 DIAGNOSIS — M545 Low back pain, unspecified: Secondary | ICD-10-CM | POA: Diagnosis not present

## 2023-08-16 DIAGNOSIS — G8929 Other chronic pain: Secondary | ICD-10-CM | POA: Diagnosis not present

## 2023-08-22 DIAGNOSIS — E1169 Type 2 diabetes mellitus with other specified complication: Secondary | ICD-10-CM | POA: Diagnosis not present

## 2023-08-22 DIAGNOSIS — I10 Essential (primary) hypertension: Secondary | ICD-10-CM | POA: Diagnosis not present

## 2023-08-22 DIAGNOSIS — K2270 Barrett's esophagus without dysplasia: Secondary | ICD-10-CM | POA: Diagnosis not present

## 2023-08-22 DIAGNOSIS — G4721 Circadian rhythm sleep disorder, delayed sleep phase type: Secondary | ICD-10-CM | POA: Diagnosis not present

## 2023-08-22 DIAGNOSIS — G4733 Obstructive sleep apnea (adult) (pediatric): Secondary | ICD-10-CM | POA: Diagnosis not present

## 2023-09-12 DIAGNOSIS — G4733 Obstructive sleep apnea (adult) (pediatric): Secondary | ICD-10-CM | POA: Diagnosis not present

## 2023-09-12 DIAGNOSIS — G4721 Circadian rhythm sleep disorder, delayed sleep phase type: Secondary | ICD-10-CM | POA: Diagnosis not present

## 2023-09-12 DIAGNOSIS — I10 Essential (primary) hypertension: Secondary | ICD-10-CM | POA: Diagnosis not present

## 2023-09-26 DIAGNOSIS — G4733 Obstructive sleep apnea (adult) (pediatric): Secondary | ICD-10-CM | POA: Diagnosis not present

## 2023-09-26 DIAGNOSIS — G4721 Circadian rhythm sleep disorder, delayed sleep phase type: Secondary | ICD-10-CM | POA: Diagnosis not present

## 2023-09-28 DIAGNOSIS — G4700 Insomnia, unspecified: Secondary | ICD-10-CM | POA: Diagnosis not present

## 2023-09-28 DIAGNOSIS — R609 Edema, unspecified: Secondary | ICD-10-CM | POA: Diagnosis not present

## 2023-09-28 DIAGNOSIS — K219 Gastro-esophageal reflux disease without esophagitis: Secondary | ICD-10-CM | POA: Diagnosis not present

## 2023-09-28 DIAGNOSIS — G4733 Obstructive sleep apnea (adult) (pediatric): Secondary | ICD-10-CM | POA: Diagnosis not present

## 2023-09-28 DIAGNOSIS — E1165 Type 2 diabetes mellitus with hyperglycemia: Secondary | ICD-10-CM | POA: Diagnosis not present

## 2023-09-28 DIAGNOSIS — E7800 Pure hypercholesterolemia, unspecified: Secondary | ICD-10-CM | POA: Diagnosis not present

## 2023-09-28 DIAGNOSIS — M79671 Pain in right foot: Secondary | ICD-10-CM | POA: Diagnosis not present

## 2023-09-28 DIAGNOSIS — I10 Essential (primary) hypertension: Secondary | ICD-10-CM | POA: Diagnosis not present

## 2023-09-28 DIAGNOSIS — M62830 Muscle spasm of back: Secondary | ICD-10-CM | POA: Diagnosis not present

## 2023-09-28 DIAGNOSIS — M170 Bilateral primary osteoarthritis of knee: Secondary | ICD-10-CM | POA: Diagnosis not present

## 2023-09-28 DIAGNOSIS — L309 Dermatitis, unspecified: Secondary | ICD-10-CM | POA: Diagnosis not present

## 2023-10-15 DIAGNOSIS — M170 Bilateral primary osteoarthritis of knee: Secondary | ICD-10-CM | POA: Diagnosis not present

## 2023-10-15 DIAGNOSIS — E7800 Pure hypercholesterolemia, unspecified: Secondary | ICD-10-CM | POA: Diagnosis not present

## 2023-10-15 DIAGNOSIS — E1165 Type 2 diabetes mellitus with hyperglycemia: Secondary | ICD-10-CM | POA: Diagnosis not present

## 2023-10-30 ENCOUNTER — Ambulatory Visit: Admitting: Cardiology

## 2023-11-15 DIAGNOSIS — E7800 Pure hypercholesterolemia, unspecified: Secondary | ICD-10-CM | POA: Diagnosis not present

## 2023-11-15 DIAGNOSIS — E1165 Type 2 diabetes mellitus with hyperglycemia: Secondary | ICD-10-CM | POA: Diagnosis not present

## 2023-11-15 DIAGNOSIS — M170 Bilateral primary osteoarthritis of knee: Secondary | ICD-10-CM | POA: Diagnosis not present

## 2023-12-13 DIAGNOSIS — K219 Gastro-esophageal reflux disease without esophagitis: Secondary | ICD-10-CM | POA: Diagnosis not present

## 2023-12-13 DIAGNOSIS — G4733 Obstructive sleep apnea (adult) (pediatric): Secondary | ICD-10-CM | POA: Diagnosis not present

## 2023-12-13 DIAGNOSIS — I10 Essential (primary) hypertension: Secondary | ICD-10-CM | POA: Diagnosis not present

## 2023-12-13 DIAGNOSIS — E7800 Pure hypercholesterolemia, unspecified: Secondary | ICD-10-CM | POA: Diagnosis not present

## 2023-12-13 DIAGNOSIS — Z0000 Encounter for general adult medical examination without abnormal findings: Secondary | ICD-10-CM | POA: Diagnosis not present

## 2023-12-13 DIAGNOSIS — R609 Edema, unspecified: Secondary | ICD-10-CM | POA: Diagnosis not present

## 2023-12-13 DIAGNOSIS — E1165 Type 2 diabetes mellitus with hyperglycemia: Secondary | ICD-10-CM | POA: Diagnosis not present

## 2023-12-13 DIAGNOSIS — G4700 Insomnia, unspecified: Secondary | ICD-10-CM | POA: Diagnosis not present

## 2023-12-13 DIAGNOSIS — M170 Bilateral primary osteoarthritis of knee: Secondary | ICD-10-CM | POA: Diagnosis not present

## 2023-12-13 LAB — LAB REPORT - SCANNED: A1c: 7.3

## 2023-12-16 DIAGNOSIS — M170 Bilateral primary osteoarthritis of knee: Secondary | ICD-10-CM | POA: Diagnosis not present

## 2023-12-16 DIAGNOSIS — E1165 Type 2 diabetes mellitus with hyperglycemia: Secondary | ICD-10-CM | POA: Diagnosis not present

## 2023-12-16 DIAGNOSIS — E7800 Pure hypercholesterolemia, unspecified: Secondary | ICD-10-CM | POA: Diagnosis not present

## 2024-01-15 DIAGNOSIS — E7800 Pure hypercholesterolemia, unspecified: Secondary | ICD-10-CM | POA: Diagnosis not present

## 2024-01-15 DIAGNOSIS — E1165 Type 2 diabetes mellitus with hyperglycemia: Secondary | ICD-10-CM | POA: Diagnosis not present

## 2024-01-15 DIAGNOSIS — M170 Bilateral primary osteoarthritis of knee: Secondary | ICD-10-CM | POA: Diagnosis not present

## 2024-04-18 ENCOUNTER — Other Ambulatory Visit: Payer: Self-pay | Admitting: Nurse Practitioner

## 2024-04-18 DIAGNOSIS — N5089 Other specified disorders of the male genital organs: Secondary | ICD-10-CM

## 2024-04-21 ENCOUNTER — Emergency Department

## 2024-04-21 ENCOUNTER — Other Ambulatory Visit: Payer: Self-pay

## 2024-04-21 ENCOUNTER — Emergency Department
Admission: EM | Admit: 2024-04-21 | Discharge: 2024-04-21 | Disposition: A | Discharge status: Home / Self Care | Attending: Emergency Medicine | Admitting: Emergency Medicine

## 2024-04-21 DIAGNOSIS — Z79899 Other long term (current) drug therapy: Secondary | ICD-10-CM | POA: Insufficient documentation

## 2024-04-21 DIAGNOSIS — I10 Essential (primary) hypertension: Secondary | ICD-10-CM | POA: Insufficient documentation

## 2024-04-21 DIAGNOSIS — Z7984 Long term (current) use of oral hypoglycemic drugs: Secondary | ICD-10-CM | POA: Diagnosis not present

## 2024-04-21 DIAGNOSIS — E119 Type 2 diabetes mellitus without complications: Secondary | ICD-10-CM | POA: Diagnosis not present

## 2024-04-21 DIAGNOSIS — E876 Hypokalemia: Secondary | ICD-10-CM | POA: Insufficient documentation

## 2024-04-21 DIAGNOSIS — K4090 Unilateral inguinal hernia, without obstruction or gangrene, not specified as recurrent: Secondary | ICD-10-CM | POA: Diagnosis not present

## 2024-04-21 DIAGNOSIS — N5089 Other specified disorders of the male genital organs: Secondary | ICD-10-CM | POA: Diagnosis present

## 2024-04-21 LAB — CBC
HCT: 49.3 % (ref 39.0–52.0)
Hemoglobin: 17.7 g/dL — ABNORMAL HIGH (ref 13.0–17.0)
MCH: 30.7 pg (ref 26.0–34.0)
MCHC: 35.9 g/dL (ref 30.0–36.0)
MCV: 85.6 fL (ref 80.0–100.0)
Platelets: 221 K/uL (ref 150–400)
RBC: 5.76 MIL/uL (ref 4.22–5.81)
RDW: 13.3 % (ref 11.5–15.5)
WBC: 7 K/uL (ref 4.0–10.5)
nRBC: 0 % (ref 0.0–0.2)

## 2024-04-21 LAB — URINALYSIS, ROUTINE W REFLEX MICROSCOPIC
Bacteria, UA: NONE SEEN
Bilirubin Urine: NEGATIVE
Glucose, UA: >=500 mg/dL — AB
Hgb urine dipstick: NEGATIVE
Ketones, ur: NEGATIVE mg/dL
Leukocytes,Ua: NEGATIVE
Nitrite: NEGATIVE
Protein, ur: NEGATIVE mg/dL
Specific Gravity, Urine: 1.027 (ref 1.005–1.030)
Squamous Epithelial / HPF: 0 /HPF (ref 0–5)
pH: 6 (ref 5.0–8.0)

## 2024-04-21 LAB — BASIC METABOLIC PANEL WITH GFR
Anion gap: 14 (ref 5–15)
BUN: 22 mg/dL (ref 8–23)
CO2: 28 mmol/L (ref 22–32)
Calcium: 9.8 mg/dL (ref 8.9–10.3)
Chloride: 96 mmol/L — ABNORMAL LOW (ref 98–111)
Creatinine, Ser: 0.91 mg/dL (ref 0.61–1.24)
GFR, Estimated: >60 mL/min
Glucose, Bld: 167 mg/dL — ABNORMAL HIGH (ref 70–99)
Potassium: 3 mmol/L — ABNORMAL LOW (ref 3.5–5.1)
Sodium: 138 mmol/L (ref 135–145)

## 2024-04-21 LAB — CHLAMYDIA/NGC RT PCR (ARMC ONLY)
Chlamydia Tr: NOT DETECTED
N gonorrhoeae: NOT DETECTED

## 2024-04-21 MED ORDER — POTASSIUM CHLORIDE 20 MEQ PO PACK
40.0000 meq | PACK | Freq: Once | ORAL | Status: AC
  Administered 2024-04-21: 40 meq via ORAL
  Filled 2024-04-21: qty 2

## 2024-04-21 NOTE — ED Provider Notes (Signed)
 "  [facility] Provider Note    Event Date/Time   First MD Initiated Contact with Patient 04/21/24 0231     (approximate)   History   Testicle Pain   HPI  Noah Austin is a 69 y.o. male with history of obesity, hypertension, diabetes, hyperlipidemia, sleep apnea who presents to the emergency department with complaints of right testicular swelling that worsens throughout the day and improves when he wakes up in the morning ongoing for 2 weeks.  States he saw his outpatient provider and they ordered an ultrasound.  States that they never called him for the ultrasound and he was concerned he had a hernia.  He states he came to the hospital tonight to have it taken care of.  He has had previous incarcerated inguinal hernia in 2013 that required admission and surgical intervention.  He wanted to be admitted today for surgery.  He denies any fever, skin discoloration, discomfort currently, dysuria, penile discharge, nausea or vomiting.  States he is having bowel movements and passing gas.  States that he was hoping to have surgical intervention today because he did not want to have 2 visits and he has a upcoming cruise.   History provided by patient.    Past Medical History:  Diagnosis Date   Barrett's esophagus    Diabetes mellitus without complication (HCC)    type 2   Dyslipidemia    Hyperlipidemia    Hypertension    Obesity (BMI 35.0-39.9 without comorbidity)    Sleep apnea     Past Surgical History:  Procedure Laterality Date   ESOPHAGOGASTRODUODENOSCOPY     x4   HERNIA REPAIR  07/17/2011   umbilical hernia   left inguinal hernia     Repair about 27 years ago, about 25 yrs for 2nd surgery   LUMBAR LAMINECTOMY/DECOMPRESSION MICRODISCECTOMY Right 06/14/2023   Procedure: LUMBAR DECOMPRESSION LUMBAR FOUR - FIVE;  Surgeon: Burnetta Aures, MD;  Location: [facility] OR;  Service: Orthopedics;  Laterality: Right;    SHOULDER OPEN ROTATOR CUFF REPAIR  Right 05/14/2014   Procedure: RIGHT SHOULDER MINI OPEN ROTATOR CUFF REPAIR ;  Surgeon: Reyes JAYSON Billing, MD;  Location: [facility] ORS;  Service: Orthopedics;  Laterality: Right;   UMBILICAL HERNIA REPAIR  07/17/2011   Procedure: HERNIA REPAIR UMBILICAL ADULT;  Surgeon: Elon CHRISTELLA Pacini, MD;  Location: [facility] ORS;  Service: General;  Laterality: N/A;  repair strangulated umbilical hernia    MEDICATIONS:  Prior to Admission medications  Medication Sig Start Date End Date Taking? Authorizing Provider  Continuous Glucose Sensor (DEXCOM G7 SENSOR) MISC 1 Device by Does not apply route continuous. 04/25/23   Motwani, Komal, MD  empagliflozin  (JARDIANCE ) 25 MG TABS tablet Take 25 mg by mouth daily. 06/15/21   [provider]  furosemide  (LASIX ) 20 MG tablet Take 20 mg by mouth daily. 09/01/21   [provider]  indapamide  (LOZOL ) 2.5 MG tablet Take 2.5 mg by mouth every morning. 02/15/21   [provider]  Lancets (ONETOUCH DELICA PLUS [place]) MISC SMARTSIG:1 Topical Daily 12/31/22   [provider]  lisinopril  (ZESTRIL ) 20 MG tablet Take 20 mg by mouth daily.    [provider]  metFORMIN  (GLUCOPHAGE -XR) 500 MG 24 hr tablet Take 1,000 mg by mouth daily with breakfast. 02/07/21   [provider]  methocarbamol  (ROBAXIN ) 500 MG tablet Take 500 mg by mouth every 8 (eight) hours as needed.    [provider]  metoprolol  succinate (TOPROL -XL) 50 MG 24  hr tablet Take 1 tablet (50 mg total) by mouth in the morning. TAKE 1 TABLET BY MOUTH EVERY  MORNING WITH OR IMMEDIATELY  FOLLOWING A MEAL 07/02/23   Tolia, Sunit, DO  Misc Natural Products (OSTEO BI-FLEX ADV TRIPLE ST) TABS Take 2 tablets by mouth daily.    [provider]  ondansetron  (ZOFRAN ) 4 MG tablet Take 1 tablet (4 mg total) by mouth every 8 (eight) hours as needed for nausea or vomiting. 06/14/23   Burnetta Aures, MD  [facility] VERIO test strip 1 each daily. 12/31/22   [provider]   oxyCODONE -acetaminophen  (PERCOCET) 10-325 MG tablet Take 1 tablet by mouth 3 (three) times daily as needed.    [provider]  pantoprazole  (PROTONIX ) 20 MG tablet Take 20 mg by mouth daily. 09/14/20   [provider]  potassium chloride  SA (KLOR-CON  M) 20 MEQ tablet Take 20 mEq by mouth daily. 03/07/23   [provider]  pravastatin  (PRAVACHOL ) 40 MG tablet Take 40 mg by mouth at bedtime.     [provider]  zolpidem  (AMBIEN ) 5 MG tablet Take 5 mg by mouth at bedtime. 04/06/23   [provider]    Physical Exam   Triage Vital Signs: ED Triage Vitals  Encounter Vitals Group     BP 04/21/24 0128 115/77     Girls Systolic BP Percentile --      Girls Diastolic BP Percentile --      Boys Systolic BP Percentile --      Boys Diastolic BP Percentile --      Pulse Rate 04/21/24 0128 92     Resp 04/21/24 0128 16     Temp 04/21/24 0128 98.1 F (36.7 C)     Temp Source 04/21/24 0128 Oral     SpO2 04/21/24 0128 99 %     Weight --      Height --      Head Circumference --      Peak Flow --      Pain Score 04/21/24 0126 1     Pain Loc --      Pain Education --      Exclude from Growth Chart --     Most recent vital signs: Vitals:   04/21/24 0248 04/21/24 0251  BP: 101/70 101/70  Pulse: 93 93  Resp:  18  Temp:    SpO2:  98%    CONSTITUTIONAL: Alert, responds appropriately to questions.  Elderly, obese HEAD: Normocephalic, atraumatic EYES: Conjunctivae clear, pupils appear equal, sclera nonicteric ENT: normal nose; moist mucous membranes NECK: Supple, normal ROM CARD: RRR; S1 and S2 appreciated RESP: Normal chest excursion without splinting or tachypnea; breath sounds clear and equal bilaterally; no wheezes, no rhonchi, no rales, no hypoxia or respiratory distress, speaking full sentences ABD/GI: Non-distended; soft, non-tender, no rebound, no guarding, no peritoneal signs, right inguinal hernia that is easily reducible and nontender  to palpation without overlying skin changes GU:  Normal external genitalia, circumcised male, normal penile shaft, no blood or discharge at the urethral meatus, no testicular masses or tenderness on exam, no scrotal masses or swelling, no hernias appreciated, 2+ femoral pulses bilaterally; no perineal erythema, warmth, subcutaneous air or crepitus; no high riding testicle.  Chaperone present for exam Hershall Pinal, RN)    ED Results / Procedures / Treatments   LABS: (all labs ordered are listed, but only abnormal results are displayed) Labs Reviewed  URINALYSIS, ROUTINE W REFLEX MICROSCOPIC - Abnormal; Notable for the following  components:      Result Value   Color, Urine YELLOW (*)    APPearance CLEAR (*)    Glucose, UA >=500 (*)    All other components within normal limits  BASIC METABOLIC PANEL WITH GFR - Abnormal; Notable for the following components:   Potassium 3.0 (*)    Chloride 96 (*)    Glucose, Bld 167 (*)    All other components within normal limits  CBC - Abnormal; Notable for the following components:   Hemoglobin 17.7 (*)    All other components within normal limits  CHLAMYDIA/NGC RT PCR ([facility] ONLY)            URINE CULTURE     EKG:  EKG Interpretation Date/Time:    Ventricular Rate:    PR Interval:    QRS Duration:    QT Interval:    QTC Calculation:   R Axis:      Text Interpretation:           RADIOLOGY: My personal review and interpretation of imaging: Ultrasound shows no testicular abnormality.  Normal blood flow.  He does have a right inguinal hernia.  I have personally reviewed all radiology reports.   US  SCROTUM W/DOPPLER Result Date: 04/21/2024 EXAM: ULTRASOUND SCROTUM/TESTICLES WITH DOPPLER FLOW EVALUATION 04/21/2024 02:39:07 AM TECHNIQUE: Duplex ultrasound using B-mode/gray scaled imaging, Doppler spectral analysis and color flow Doppler was obtained of the testicles. COMPARISON: None available. CLINICAL HISTORY: Testicle pain. FINDINGS:  RIGHT: GREY SCALE: The right testicle measures 3.8 x 3.3 x 2.8 cm. It demonstrates normal homogeneous echotexture without focal lesion. No testicular microlithiasis. DOPPLER EVALUATION: There is normal arterial and venous Doppler flow within the testicle. VARICOCELE: Mild right varicocele. SCROTAL SAC: Small hydrocele. Right inguinal hernia containing bowel. EPIDIDYMIS: Right epididymal cyst measures 4 mm. LEFT: GREY SCALE: The left testicle measures 4.3 x 2.7 x 2.3 cm. It demonstrates normal homogeneous echotexture without focal lesion. No testicular microlithiasis. DOPPLER EVALUATION: There is normal arterial and venous Doppler flow within the testicle. VARICOCELE: No scrotal varicocele. SCROTAL SAC: Small hydrocele. EPIDIDYMIS: No acute abnormality. IMPRESSION: 1. No testicular abnormality. 2. Right inguinal hernia containing bowel. 3. Small bilateral hydroceles. 4. Mild right varicocele. Electronically signed by: Franky Crease MD 04/21/2024 02:43 AM EST RP Workstation: [id number]     PROCEDURES:  Critical Care performed: No      Procedures    IMPRESSION / MDM / ASSESSMENT AND PLAN / ED COURSE  I reviewed the triage vital signs and the nursing notes.    Patient here with symptoms of inguinal hernia.    DIFFERENTIAL DIAGNOSIS (includes but not limited to):   Inguinal hernia, no signs of strangulation or incarceration, no signs of bowel obstruction, less likely testicular torsion, doubt epididymitis, orchitis, UTI, STI   Patient's presentation is most consistent with acute, uncomplicated illness.   PLAN: Patient here with symptoms of an inguinal hernia.  He states he is here because he wanted to be admitted to the hospital to have surgery to fix his hernia because it is inconvenient for him and he has an upcoming cruise.  He denies any current pain, fevers, vomiting, abdominal distention.  He is having bowel movements and passing gas.  Labs obtained from triage show no leukocytosis.   Slightly low potassium of 3.0 which appears chronic.  Will give replacement here.  Urine shows no sign of infection.  He does report he is on Cipro by his primary care provider.  Unclear why he is on Cipro.  He is not having any symptoms of UTI, prostatitis, orchitis, epididymitis.  Ultrasound obtained from triage and reviewed/interpreted by myself and the radiologist and shows normal blood flow to both testicles.  He does have a right inguinal hernia containing bowel.  No other acute abnormality noted.  Patient frustrated because he does not meet criteria for admission for surgical intervention.  Discussed with patient that this is not something that we would emergently consult general surgery for as this would be best done as an outpatient especially given his multiple comorbidities as he would likely need medical clearance first.  He has no symptoms of incarceration, strangulation, bowel obstruction.  Hospitalist also agrees that this would be managed outpatient.  Will give general surgery follow-up information and strict return precautions.   MEDICATIONS GIVEN IN ED: Medications  potassium chloride  (KLOR-CON ) packet 40 mEq (has no administration in time range)     ED COURSE:  At this time, I do not feel there is any life-threatening condition present. I reviewed all nursing notes, vitals, pertinent previous records.  All lab and urine results, EKGs, imaging ordered have been independently reviewed and interpreted by myself.  I reviewed all available radiology reports from any imaging ordered this visit.  Based on my assessment, I feel the patient is safe to be discharged home without further emergent workup and can continue workup as an outpatient as needed. Discussed all findings, treatment plan as well as usual and customary return precautions.  They verbalize understanding and are comfortable with this plan.  Outpatient follow-up has been provided as needed.  All questions have been  answered.    CONSULTS:  none   OUTSIDE RECORDS REVIEWED: Reviewed admission in 2013 for incarcerated hernia.       FINAL CLINICAL IMPRESSION(S) / ED DIAGNOSES   Final diagnoses:  Right inguinal hernia     Rx / DC Orders   ED Discharge Orders     None        Note:  This document was prepared using Dragon voice recognition software and may include unintentional dictation errors.   Lynnox Girten, Josette SAILOR, DO 04/21/24 [phone]  "

## 2024-04-21 NOTE — ED Notes (Signed)
 Dr. Neomi currently speaking with patient in regards to his hernia being stable and not requiring surgery at this time. Patient requesting to still be admitted to have hernia repaired due to convenience for his cruise. Dr. Neomi spoke to hospitalist and is denying admission at this time.

## 2024-04-21 NOTE — ED Triage Notes (Signed)
 Pt reports concern right testicular pain and swelling over the past 2 weeks. Was seen at PCP 12/29 - started on ciprofloxacin and pending outpatient imaging. Denies difficulty with urination.

## 2024-04-21 NOTE — ED Notes (Signed)
 Discharge paperwork reviewed with patient. Patient verbalizes understanding with f/u to outpatient surgery for his hernia repair.

## 2024-04-21 NOTE — Discharge Instructions (Signed)
 You may take Tylenol  1000 mg every 6 hours as needed for pain.  You were admitted to the hospital in 2013 for hernia repair as you had an incarcerated hernia at that time.  You have no sign of incarceration or strangulation now.  If you develop severe pain, overlying skin discoloration, vomiting, unable to have a bowel movement or pass gas, significant abdominal distention, unable to reduce your hernia at home, please return to the emergency department as you may need emergent surgery at that time.  Your condition today is not emergent and does not meet criteria for admission to the hospital for emergent surgical intervention.  We have provided you with information for general surgery and I recommend that you call them in the morning to schedule an appointment.

## 2024-04-22 LAB — URINE CULTURE: Culture: NO GROWTH

## 2024-05-09 ENCOUNTER — Other Ambulatory Visit: Payer: Self-pay | Admitting: Cardiology

## 2024-05-09 ENCOUNTER — Other Ambulatory Visit: Payer: Self-pay | Admitting: Surgery

## 2024-05-09 DIAGNOSIS — I491 Atrial premature depolarization: Secondary | ICD-10-CM

## 2024-05-09 DIAGNOSIS — K4090 Unilateral inguinal hernia, without obstruction or gangrene, not specified as recurrent: Secondary | ICD-10-CM

## 2024-05-21 ENCOUNTER — Inpatient Hospital Stay
Admission: RE | Admit: 2024-05-21 | Discharge: 2024-05-21 | Disposition: A | Source: Ambulatory Visit | Attending: Surgery

## 2024-05-21 DIAGNOSIS — K4090 Unilateral inguinal hernia, without obstruction or gangrene, not specified as recurrent: Secondary | ICD-10-CM

## 2024-05-21 MED ORDER — IOPAMIDOL (ISOVUE-370) INJECTION 76%
80.0000 mL | Freq: Once | INTRAVENOUS | Status: AC | PRN
  Administered 2024-05-21: 80 mL via INTRAVENOUS

## 2024-06-13 ENCOUNTER — Ambulatory Visit: Admitting: Cardiology
